# Patient Record
Sex: Female | Born: 1965
Health system: Southern US, Community
[De-identification: ages and names within clinical notes are randomized; demographics above are authoritative.]

## PROBLEM LIST (undated history)

## (undated) DIAGNOSIS — I10 Essential (primary) hypertension: Secondary | ICD-10-CM

## (undated) DIAGNOSIS — E079 Disorder of thyroid, unspecified: Secondary | ICD-10-CM

## (undated) DIAGNOSIS — F419 Anxiety disorder, unspecified: Secondary | ICD-10-CM

## (undated) DIAGNOSIS — F32A Depression, unspecified: Secondary | ICD-10-CM

## (undated) HISTORY — PX: WISDOM TOOTH EXTRACTION: SHX21

## (undated) HISTORY — DX: Depression, unspecified: F32.A

## (undated) HISTORY — DX: Disorder of thyroid, unspecified: E07.9

## (undated) HISTORY — DX: Anxiety disorder, unspecified: F41.9

## (undated) HISTORY — DX: Essential (primary) hypertension: I10

---

## 2015-07-07 ENCOUNTER — Encounter: Payer: Self-pay | Admitting: Family Medicine

## 2015-07-07 ENCOUNTER — Ambulatory Visit (INDEPENDENT_AMBULATORY_CARE_PROVIDER_SITE_OTHER): Payer: BLUE CROSS/BLUE SHIELD | Admitting: Family Medicine

## 2015-07-07 ENCOUNTER — Ambulatory Visit (INDEPENDENT_AMBULATORY_CARE_PROVIDER_SITE_OTHER): Payer: BLUE CROSS/BLUE SHIELD

## 2015-07-07 ENCOUNTER — Telehealth: Payer: Self-pay | Admitting: Family Medicine

## 2015-07-07 VITALS — BP 132/82 | HR 86 | Temp 97.7°F | Resp 18

## 2015-07-07 DIAGNOSIS — R002 Palpitations: Secondary | ICD-10-CM | POA: Diagnosis not present

## 2015-07-07 DIAGNOSIS — R0600 Dyspnea, unspecified: Secondary | ICD-10-CM | POA: Diagnosis not present

## 2015-07-07 DIAGNOSIS — R079 Chest pain, unspecified: Secondary | ICD-10-CM

## 2015-07-07 DIAGNOSIS — M898X1 Other specified disorders of bone, shoulder: Secondary | ICD-10-CM

## 2015-07-07 DIAGNOSIS — F411 Generalized anxiety disorder: Secondary | ICD-10-CM | POA: Diagnosis not present

## 2015-07-07 LAB — COMPREHENSIVE METABOLIC PANEL
ALT: 36 U/L — ABNORMAL HIGH (ref 6–29)
AST: 24 U/L (ref 10–35)
Albumin: 4.4 g/dL (ref 3.6–5.1)
Alkaline Phosphatase: 78 U/L (ref 33–115)
BUN: 14 mg/dL (ref 7–25)
CHLORIDE: 101 mmol/L (ref 98–110)
CO2: 26 mmol/L (ref 20–31)
CREATININE: 0.58 mg/dL (ref 0.50–1.10)
Calcium: 9.5 mg/dL (ref 8.6–10.2)
GLUCOSE: 85 mg/dL (ref 65–99)
POTASSIUM: 4.3 mmol/L (ref 3.5–5.3)
SODIUM: 140 mmol/L (ref 135–146)
TOTAL PROTEIN: 6.8 g/dL (ref 6.1–8.1)
Total Bilirubin: 0.5 mg/dL (ref 0.2–1.2)

## 2015-07-07 LAB — LIPID PANEL
CHOL/HDL RATIO: 3.8 ratio (ref <=5.0)
Cholesterol: 180 mg/dL (ref 125–200)
HDL: 48 mg/dL (ref >=46)
LDL CALC: 100 mg/dL (ref <130)
Triglycerides: 158 mg/dL — ABNORMAL HIGH (ref <150)
VLDL: 32 mg/dL — AB (ref <30)

## 2015-07-07 LAB — TROPONIN I: Troponin I: <0.01 ng/mL (ref <0.06)

## 2015-07-07 LAB — TSH: TSH: 4.873 u[IU]/mL — ABNORMAL HIGH (ref 0.350–4.500)

## 2015-07-07 MED ORDER — LORAZEPAM 0.5 MG PO TABS: 0.5000 mg | ORAL_TABLET | Freq: Two times a day (BID) | ORAL | Status: DC | PRN

## 2015-07-07 NOTE — Telephone Encounter (Signed)
[facility] spoke to [place] about the STAT Troponin lab. Alli spoke to Dr. Alwyn Ren about the result. Hopper verbalized to call patient and inform her the Lab is normal. Patient was notified and voiced understanding.

## 2015-07-07 NOTE — Patient Instructions (Addendum)
Get some Debrox or Murine Drops and try to clean out left ear as directed  In the event of any worsening chest pain or call 911 and go to the emergency room  Take Aleve 2 pills twice daily for pain and inflammation in the back  Referral is being made to cardiology to evaluate you for the palpitations  Take lorazepam 0.5 mg 1 twice daily for anxiety  Return if further problems

## 2015-07-07 NOTE — Progress Notes (Signed)
Chief complaint: Palpitations, shortness of breath, and scapular pain Subjective:  Patient ID: Mary Horn, female    DOB: [birth year]  Age: 49 y.o. MRN: [id number]  49 year old lady who comes in here for the first time. She came in from work. For the last 3 days she has had some pain in her left scapular area, actually just medial to the scapula. Certain positions seem to hurt more. She had that Sunday, yesterday, and today a little bit when she went to work. She has been under stress at work with deadlines. She started having some sensation of breathing harder and palpitations and heart racing. She said she felt a little funny in her left arm, not really numbness, not pain. She did not have chest pain. She was advised to come on in here and get checked. She does not smoke. There is no significant family history of heart disease. She has never had any cardiovascular diseases. She has been getting a little more winded than usual of late. She had a similar episode to this about a month ago. She is single, lives alone, does not get regular exercise. She is in a position of having to help meet some deadlines.  Constitutional: Unremarkable except for some dyspnea on exertion HEENT: Negative Respiratory: As above Cardiovascular: As above GI: Unremarkable GU: Unremarkable. Early menopause. Muscular skeletal: Scapula pain is noted Dermatologic: Unremarkable Neurologic: Unremarkable Psychiatric: Anxious and stressed Endocrine: Not diabetic Objective:   Pleasant lady, alert, anxious looking. Had a couple of tears in her eyes. Her left ear is been bothering her also. The left TM is occluded with cerumen. Right has a little bit in the canal. Throat clear. Neck supple without nodes. Chest is clear to auscultation. Heart regular without murmurs gallops or arrhythmias. No chest wall tenderness. Abdomen soft without mass or tenderness. Has a little tenderness just medial to the right scapula.  EKG normal sinus  rhythm. Minimal decrease of transition of R waves, probably insignificant. No ST segment changes right normal.  [facility] reading (PRIMARY) by  Dr. Alwyn Ren Normal chest x-ray.    Assessment & Plan:   Assessment:  Palpitations Scapula pain Shortness of breath Stress/anxiety Abnormal sensation left arm Cerumen impaction left ear  Plan:  Treat for the stress and anxiety and back pain. Call ahead and make referral to cardiology for the palpitations though I do not detect any arrhythmia and feel like this is a benign entity. Discussed with patient in detail. Do have a troponin pending today just to be on the safe side.  Patient Instructions  Get some Debrox or Murine Drops and try to clean out left ear as directed  In the event of any worsening chest pain or call 911 and go to the emergency room  Take Aleve 2 pills twice daily for pain and inflammation in the back  Referral is being made to cardiology to evaluate you for the palpitations  Take lorazepam 0.5 mg 1 twice daily for anxiety  Return if further problems   Got the result back on the troponin and it was normal and I had the staff call her. Breannah Kratt, MD 07/07/2015

## 2015-07-12 ENCOUNTER — Telehealth: Payer: Self-pay | Admitting: Family Medicine

## 2015-07-12 NOTE — Telephone Encounter (Signed)
Please review labs patient wanting to know results

## 2015-07-14 ENCOUNTER — Encounter: Payer: Self-pay | Admitting: Family Medicine

## 2015-08-04 ENCOUNTER — Encounter: Payer: Self-pay | Admitting: Cardiology

## 2015-08-04 DIAGNOSIS — E669 Obesity, unspecified: Secondary | ICD-10-CM | POA: Insufficient documentation

## 2015-08-04 NOTE — Progress Notes (Signed)
Patient ID: Beretta Ginsberg, female   DOB: 1966/11/11, 49 y.o.   MRN: [id number]   Charitie, Hinote    Date of visit:  08/04/2015 DOB:  1966-02-22    Age:  48 yrs. Medical record number:  [id number]     Account number:  [id number] Primary Care Provider: HOPPER,DAVID ____________________________ CURRENT DIAGNOSES  1. Obesity  2. Palpitations  3. Chest pain ____________________________ ALLERGIES  No Known Allergies ____________________________ MEDICATIONS  1. lorazepam 0.5 mg tablet, BID ____________________________ CHIEF COMPLAINTS  Followup of Chest pain  Palp ____________________________ HISTORY OF PRESENT ILLNESS This very nice 49 year old female is seen for evaluation of shoulder and back pain as well as palpitations. The patient other than obesity has been in good health and is really not seen a doctor in the past 10 years. Several weeks ago she developed fairly severe pain in her left upper shoulder and in her mid back that occurred at night and was severe and lasted for several minutes. It occurred on and off during the night and then ay some. She later went to work and had another episode that occurred at work and was concerned about it as it was associated with some palpitations and some mild shortness of breath. She went and saw Dr. Alwyn Ren and has not had any recurrent symptoms since then. She did admit to being under a lot of situational stress at her work place with deadlines and also has noted intense personal stress over the past several years related to her job situation as well as some family deaths. She currently lives alone. She does not have exertional symptoms. She has been obese but does not weigh and doesn't want to know how much her weight is. The more she thought about her shoulder pain she would have palpitations and then developed some vague pain in her anterior chest when she would have the shoulder pain. The symptoms are nonexertional and are not related to food. They're not  pleuritic ____________________________ PAST HISTORY  Past Medical Illnesses:  obesity, denies hypertension or diabetes;  Cardiovascular Illnesses:  no previous history of cardiac disease.;  Surgical Procedures:  no prior surgical procedures;  NYHA Classification:  I;  Canadian Angina Classification:  Class 0: Asymptomatic;  Cardiology Procedures-Invasive:  no history of prior cardiac procedures;  Cardiology Procedures-Noninvasive:  no previous non-invasive procedures;  LVEF not documented,   ____________________________ CARDIO-PULMONARY TEST DATES EKG Date:  08/04/2015;   ____________________________ FAMILY HISTORY Father -- Father alive with problem Mother -- Mother dead, Carcinoma of the pancreas, Carcinoma of breast Sister -- Sister alive with problem, Seizure disorder, Fibrocystic disease Sister -- Sister alive with problem, Hypertension ____________________________ SOCIAL HISTORY Alcohol Use:  rarely;  Smoking:  never smoked;  Diet:  regular diet;  Lifestyle:  single;  Exercise:  some exercise for approximately 30 minutes 3 days per week;  Occupation:  Orthoptist;  Residence:  lives alone;   ____________________________ REVIEW OF SYSTEMS General:  obesity, weight gain  Integumentary:no rashes or new skin lesions. Eyes: Lasik surgery Ears, Nose, Throat, Mouth:  denies any hearing loss, epistaxis, hoarseness or difficulty speaking. Respiratory: denies dyspnea, cough, wheezing or hemoptysis. Cardiovascular:  please review HPI Abdominal: denies dyspepsia, GI bleeding, constipation, or diarrheaGenitourinary-Female: no dysuria, urgency, frequency, UTIs, or stress incontinence Musculoskeletal:  denies arthritis, venous insufficiency, or muscle weakness Neurological:  denies headaches, stroke, or TIA Psychiatric:  job stress Hematological/Immunologic:  denies any food allergies, bleeding disorders. ____________________________ PHYSICAL EXAMINATION VITAL SIGNS  Blood Pressure:   154/80 Sitting,  Right arm, regular cuff  , 150/76 Standing, Right arm and regular cuff   Pulse:  76/min. Weight:  217.00 lbs. Height:  65"BMI: 36  Constitutional:  pleasant white female, in no acute distress, moderately obese Skin:  warm and dry to touch, no apparent skin lesions, or masses noted. Head:  normocephalic, normal hair pattern, no masses or tenderness Eyes:  EOMS Intact, PERRLA, C and S clear, Funduscopic exam not done. ENT:  ears, nose and throat reveal no gross abnormalities.  Dentition good. Neck:  supple, without massess. No JVD, thyromegaly or carotid bruits. Carotid upstroke normal. Chest:  normal symmetry, clear to auscultation. Cardiac:  regular rhythm, normal S1 and S2, No S3 or S4, no murmurs, gallops or rubs detected. Abdomen:  abdomen soft,non-tender, no masses, no hepatospenomegaly, or aneurysm noted Peripheral Pulses:  the femoral,dorsalis pedis, and posterior tibial pulses are full and equal bilaterally with no bruits auscultated. Extremities & Back:  no deformities, clubbing, cyanosis, erythema or edema observed. Normal muscle strength and tone. Neurological:  no gross motor or sensory deficits noted, affect appropriate, oriented x3. ____________________________ MOST RECENT LIPID PANEL 07/07/15  CHOL TOTL 180 mg/dl, LDL 161 NM, HDL 48 mg/dl, TRIGLYCER 096 mg/dl and CHOL/HDL 3.8 (Calc) ____________________________ IMPRESSIONS/PLAN  1. Somewhat atypical left shoulder and mid back pain 2. Obesity 3. Palpitations 4. Elevation of BP without prior diagnosis of hyertension  Recommendations:  She has a normal exam and her EKG today is normal. Reportedly her chest x-ray was normal. My recommendations would be for her to have an exercise treadmill test and that she wear an event monitor to evaluate for palpitations. I recommended following a carbohydrate restricted diet and trying to lose weight and getting regular exercise once we know that her treadmill is okay. Her  blood pressure was elevated today but has not been elevated previously. I recommended that she tried to lose weight, restrict her salt and will monitor her blood pressure next time she is in the office.Thank you for asking me to see her with you. ____________________________ TODAYS ORDERS  1. [place]: Today  2. Return Visit: 1 month  3. 12 Lead EKG: Today                       ____________________________ Cardiology Physician:  Darden Palmer MD [facility]

## 2015-12-03 ENCOUNTER — Telehealth: Payer: Self-pay

## 2015-12-03 NOTE — Telephone Encounter (Signed)
Med Refill  LORazepam (ATIVAN) 0.5 MG tablet [phone] (H)

## 2015-12-05 NOTE — Telephone Encounter (Signed)
Call:  Return for reassessment before considering any refills.  Mary [facility]. [facility]

## 2015-12-07 NOTE — Telephone Encounter (Signed)
Left message to RTC for additional refills.

## 2018-09-03 ENCOUNTER — Encounter (INDEPENDENT_AMBULATORY_CARE_PROVIDER_SITE_OTHER): Payer: Self-pay | Admitting: Family Medicine

## 2018-09-03 ENCOUNTER — Ambulatory Visit (INDEPENDENT_AMBULATORY_CARE_PROVIDER_SITE_OTHER): Payer: BLUE CROSS/BLUE SHIELD | Admitting: Family Medicine

## 2018-09-03 VITALS — BP 154/100 | HR 69 | Ht 65.0 in | Wt 200.0 lb

## 2018-09-03 DIAGNOSIS — R030 Elevated blood-pressure reading, without diagnosis of hypertension: Secondary | ICD-10-CM | POA: Diagnosis not present

## 2018-09-03 DIAGNOSIS — E669 Obesity, unspecified: Secondary | ICD-10-CM

## 2018-09-03 DIAGNOSIS — Z1211 Encounter for screening for malignant neoplasm of colon: Secondary | ICD-10-CM

## 2018-09-03 DIAGNOSIS — R5383 Other fatigue: Secondary | ICD-10-CM

## 2018-09-03 DIAGNOSIS — R7989 Other specified abnormal findings of blood chemistry: Secondary | ICD-10-CM

## 2018-09-03 DIAGNOSIS — R110 Nausea: Secondary | ICD-10-CM | POA: Diagnosis not present

## 2018-09-03 DIAGNOSIS — Z1239 Encounter for other screening for malignant neoplasm of breast: Secondary | ICD-10-CM

## 2018-09-03 NOTE — Progress Notes (Signed)
Office Visit Note   Patient: Mary Horn           Date of Birth: November 26, 1965           MRN: [id number] Visit Date: 09/03/2018 Requested by: No referring provider defined for this encounter. PCP: Patient, No Pcp Per  Subjective: Chief Complaint  Patient presents with  . Establish PCP    HPI: She is here to establish care.  She was referred by Illa Level.  She has not been to a doctor in a couple years since she had an episode of chest pain, and before that it had been probably 5 years or more since she ever went for a checkup.  She has been concerned about her health lately.  She knows she is overweight and does not eat healthfully, she is not physically active.  A lot of her coworkers are very healthy and encouraged her to get a checkup.  She is tired much of the time.  She gets nausea in the mornings lasting several hours most days.  Occasionally she gets acid reflux.  Denies constipation or diarrhea.  Denies any hair loss or skin/nail changes.  She has checked her blood pressure at home and it has been consistently mildly elevated.  She has never had her machine calibrated.  When she was seen for chest pain a couple years ago her blood pressure was elevated at that time.  She has never been on medications for this.  She does have a family history of hypertension and her father.  She has not had colonoscopy yet.  She had a mammogram once which was normal.  She has been menopausal for 11 years but 2 years ago she had a menstrual period.  She has not had any bleeding since then.                ROS: 2 years ago her TSH was elevated.  Other systems were reviewed and are negative.  Objective: Vital Signs: BP (!) 154/100   Pulse 69   Ht 5\' 5"  (1.651 m)   Wt 200 lb (90.7 kg)   BMI 33.28 kg/m   Physical Exam:  HEENT:  [place]/AT, PERRLA, EOM Full, no nystagmus.  Funduscopic examination within normal limits.  No conjunctival erythema.  Tympanic membranes are pearly gray with  normal landmarks.  External ear canals are normal.  Nasal passages are clear.  Oropharynx is clear.  No significant lymphadenopathy.  No thyromegaly or nodules.  2+ carotid pulses without bruits. CV: Regular rate and rhythm without murmurs, rubs, or gallops.  No peripheral edema.  2+ radial and posterior tibial pulses. Lungs: Clear to auscultation throughout with no wheezing or areas of consolidation. Abd:  No HSM Ext: 2+ upper and lower DTRs, no skin or nail changes.   Imaging: None today.  Assessment & Plan: 1.  Health maintenance -We will schedule a mammogram and screening colonoscopy.  2.  Elevated blood pressure without hypertension -Start taking magnesium, minimize sodium intake, begin walking for exercise. -She will bring her home blood pressure machine in for calibration. -Return in 3 to 4 months for recheck, if blood pressure is still elevated we might start medication.  3.  Fatigue -Labs to evaluate.  4.  Obesity -Dietary changes recommended.  5.  Chronic nausea -If labs are unremarkable and symptoms persist, she might require upper endoscopy.  Abdominal CT scan would be a consideration as well since she does have a family history of pancreatic cancer into family members.  6.  History of vaginal bleeding 2 years ago -If this happens again then gynecology consult.    Follow-Up Instructions: Return in about 3 months (around 12/04/2018).       Procedures: None today.   PMFS History: Patient Active Problem List   Diagnosis Date Noted  . Obesity (BMI 30-39.9)    History reviewed. No pertinent past medical history.  History reviewed. No pertinent family history.  History reviewed. No pertinent surgical history. Social History   Occupational History  . Not on file  Tobacco Use  . Smoking status: Never Smoker  . Smokeless tobacco: Never Used  Substance and Sexual Activity  . Alcohol use: Not on file  . Drug use: Not on file  . Sexual activity: Not on file

## 2018-09-03 NOTE — Patient Instructions (Signed)
    Blood pressure:    - Magnesium 400 mg daily  - Minimize sodium intake  - Walk 20-30 minutes several days per week

## 2018-09-04 ENCOUNTER — Telehealth (INDEPENDENT_AMBULATORY_CARE_PROVIDER_SITE_OTHER): Payer: Self-pay | Admitting: Family Medicine

## 2018-09-04 ENCOUNTER — Ambulatory Visit
Admission: RE | Admit: 2018-09-04 | Discharge: 2018-09-04 | Disposition: A | Discharge status: Home / Self Care | Payer: 59 | Source: Ambulatory Visit | Attending: Family Medicine | Admitting: Family Medicine

## 2018-09-04 ENCOUNTER — Other Ambulatory Visit (INDEPENDENT_AMBULATORY_CARE_PROVIDER_SITE_OTHER): Payer: Self-pay | Admitting: Family Medicine

## 2018-09-04 ENCOUNTER — Telehealth (INDEPENDENT_AMBULATORY_CARE_PROVIDER_SITE_OTHER): Payer: Self-pay

## 2018-09-04 DIAGNOSIS — Z1239 Encounter for other screening for malignant neoplasm of breast: Secondary | ICD-10-CM

## 2018-09-04 DIAGNOSIS — E039 Hypothyroidism, unspecified: Secondary | ICD-10-CM

## 2018-09-04 DIAGNOSIS — E785 Hyperlipidemia, unspecified: Secondary | ICD-10-CM

## 2018-09-04 DIAGNOSIS — R739 Hyperglycemia, unspecified: Secondary | ICD-10-CM

## 2018-09-04 DIAGNOSIS — E559 Vitamin D deficiency, unspecified: Secondary | ICD-10-CM

## 2018-09-04 LAB — CBC WITH DIFFERENTIAL/PLATELET
BASOS ABS: 86 {cells}/uL (ref 0–200)
Basophils Relative: 1 %
Eosinophils Absolute: 224 cells/uL (ref 15–500)
Eosinophils Relative: 2.6 %
HEMATOCRIT: 42.3 % (ref 35.0–45.0)
Hemoglobin: 14.2 g/dL (ref 11.7–15.5)
Lymphs Abs: 2597 cells/uL (ref 850–3900)
MCH: 29.8 pg (ref 27.0–33.0)
MCHC: 33.6 g/dL (ref 32.0–36.0)
MCV: 88.7 fL (ref 80.0–100.0)
MPV: 10.5 fL (ref 7.5–12.5)
Monocytes Relative: 6.9 %
NEUTROS PCT: 59.3 %
Neutro Abs: 5100 cells/uL (ref 1500–7800)
PLATELETS: 349 10*3/uL (ref 140–400)
RBC: 4.77 10*6/uL (ref 3.80–5.10)
RDW: 13 % (ref 11.0–15.0)
TOTAL LYMPHOCYTE: 30.2 %
WBC mixed population: 593 cells/uL (ref 200–950)
WBC: 8.6 10*3/uL (ref 3.8–10.8)

## 2018-09-04 LAB — HEMOGLOBIN A1C
HEMOGLOBIN A1C: 5.9 %{Hb} — AB (ref <5.7)
Mean Plasma Glucose: 123 (calc)
eAG (mmol/L): 6.8 (calc)

## 2018-09-04 LAB — FERRITIN: Ferritin: 68 ng/mL (ref 16–232)

## 2018-09-04 LAB — LIPID PANEL
CHOLESTEROL: 234 mg/dL — AB (ref <200)
HDL: 56 mg/dL (ref >50)
LDL CHOLESTEROL (CALC): 141 mg/dL — AB
Non-HDL Cholesterol (Calc): 178 mg/dL (calc) — ABNORMAL HIGH (ref <130)
TRIGLYCERIDES: 229 mg/dL — AB (ref <150)
Total CHOL/HDL Ratio: 4.2 (calc) (ref <5.0)

## 2018-09-04 LAB — COMPREHENSIVE METABOLIC PANEL
AG Ratio: 2 (calc) (ref 1.0–2.5)
ALBUMIN MSPROF: 4.9 g/dL (ref 3.6–5.1)
ALKALINE PHOSPHATASE (APISO): 86 U/L (ref 33–130)
ALT: 20 U/L (ref 6–29)
AST: 16 U/L (ref 10–35)
BUN: 15 mg/dL (ref 7–25)
CHLORIDE: 103 mmol/L (ref 98–110)
CO2: 25 mmol/L (ref 20–32)
Calcium: 10.8 mg/dL — ABNORMAL HIGH (ref 8.6–10.4)
Creat: 0.66 mg/dL (ref 0.50–1.05)
GLOBULIN: 2.5 g/dL (ref 1.9–3.7)
Glucose, Bld: 69 mg/dL (ref 65–99)
POTASSIUM: 4.5 mmol/L (ref 3.5–5.3)
SODIUM: 147 mmol/L — AB (ref 135–146)
TOTAL PROTEIN: 7.4 g/dL (ref 6.1–8.1)
Total Bilirubin: 0.4 mg/dL (ref 0.2–1.2)

## 2018-09-04 LAB — HIGH SENSITIVITY CRP: hs-CRP: 2.8 mg/L

## 2018-09-04 LAB — SPECIMEN COMPROMISED

## 2018-09-04 LAB — THYROID PANEL WITH TSH
Free Thyroxine Index: 2.1 (ref 1.4–3.8)
T3 UPTAKE: 29 % (ref 22–35)
T4, Total: 7.2 ug/dL (ref 5.1–11.9)
TSH: 13.04 mIU/L — ABNORMAL HIGH

## 2018-09-04 LAB — VITAMIN D 25 HYDROXY (VIT D DEFICIENCY, FRACTURES): VIT D 25 HYDROXY: 15 ng/mL — AB (ref 30–100)

## 2018-09-04 MED ORDER — LEVOTHYROXINE SODIUM 50 MCG PO TABS: 50.0000 ug | ORAL_TABLET | Freq: Every day | ORAL | 6 refills | Status: DC

## 2018-09-04 NOTE — Telephone Encounter (Signed)
Rx sent.  Labs in about 3 months.

## 2018-09-04 NOTE — Telephone Encounter (Signed)
Called and left a VM advising patient to call back concerning lab results.

## 2018-09-04 NOTE — Telephone Encounter (Signed)
Patient returned my call concerning lab results.  Advised patient of lab results per Dr. Prince Rome.  Patient would like to know if a Rx for Synthroid will be sent to the pharmacy or will she need to go and by something over the counter?   Patient would like a copy of the lab results mailed to her current address. Cb# is [phone].  Please advise.  Thank you.

## 2018-09-04 NOTE — Telephone Encounter (Signed)
Labs are notable for the following:  Hemoglobin A1c is elevated in prediabetes range at 5.9.  It is very important to maintain a regular exercise regimen and to minimize dietary intake of processed carbohydrates including breads, pastas, cereals, sugars and sweets.  We should recheck this in about 6 months to be sure it is not approaching diabetes range.  Lipids are elevated but these should improve with lifestyle changes mentioned above.  Thyroid TSH is elevated at 13.04, indicating hypothyroidism.  I would suggest considering starting Synthroid to treat this.  If in agreement, we will recheck in about 3 to 4 months.  Vitamin D is very low at 15.  We want this to be above 50.  I recommend taking vitamin D-3, 5000 IU tablets, 2 of them daily for 3 months and then 1 daily long-term after that.  Calcium level is slightly elevated at 10.8.  Reason is unclear.  No treatment needed, but we will recheck this in 3 to 4 months as well.  Other labs look good.  We can mail her a copy if she would like.

## 2018-09-04 NOTE — Telephone Encounter (Signed)
The patient is agreeable to trying the Synthroid.  She uses [facility].  I am mailing the lab results to her and encouraged her to sign up for MyChart.

## 2018-10-09 ENCOUNTER — Encounter: Payer: Self-pay | Admitting: Family Medicine

## 2018-12-04 ENCOUNTER — Ambulatory Visit (INDEPENDENT_AMBULATORY_CARE_PROVIDER_SITE_OTHER): Payer: 59 | Admitting: Family Medicine

## 2019-07-30 ENCOUNTER — Ambulatory Visit (INDEPENDENT_AMBULATORY_CARE_PROVIDER_SITE_OTHER): Payer: 59 | Admitting: Family Medicine

## 2019-07-30 ENCOUNTER — Encounter: Payer: Self-pay | Admitting: Family Medicine

## 2019-07-30 DIAGNOSIS — M545 Low back pain, unspecified: Secondary | ICD-10-CM

## 2019-07-30 DIAGNOSIS — M79672 Pain in left foot: Secondary | ICD-10-CM | POA: Diagnosis not present

## 2019-07-30 MED ORDER — NABUMETONE 750 MG PO TABS: 750.0000 mg | ORAL_TABLET | Freq: Two times a day (BID) | ORAL | 6 refills | Status: DC | PRN

## 2019-07-30 MED ORDER — TIZANIDINE HCL 2 MG PO TABS: 2.0000 mg | ORAL_TABLET | Freq: Every evening | ORAL | 1 refills | Status: DC | PRN

## 2019-07-30 NOTE — Progress Notes (Signed)
     Office Visit Note   Patient: Mary Horn           Date of Birth: 1966/07/24           MRN: [id number] Visit Date: 07/30/2019 Requested by: No referring provider defined for this encounter. PCP: Eunice Blase, MD  Subjective: Chief Complaint  Patient presents with  . Lower Back - Pain    Pain across lower back x 2 weeks. NKI, but symptoms started 3 days after a massage. No pain radiating down the legs. No numbness/tingling.  . Left Foot - Pain    Foot started hurting a few days after the back started hurting (she is unsure if it is related). Pain is in lateral aspect of the foot. Hurts with weightbearing.    HPI: She is here with low back and left foot pain.  Her back started hurting a couple weeks ago, no definite injury.  Symptoms started about 3 days after getting a massage.  She thinks she has been walking differently because of her back pain and in the past few days her foot started to hurt on the lateral aspect.  She does not feel like she has sciatica.  She is never had problems like this before lasting this long.  Ibuprofen gives only a little bit of relief.                ROS: No fevers or chills, no bowel or bladder dysfunction.  All other systems were reviewed and are negative.  Objective: Vital Signs: There were no vitals taken for this visit.  Physical Exam:  General:  Alert and oriented, in no acute distress. Pulm:  Breathing unlabored. Psy:  Normal mood, congruent affect. Skin: No rash on her skin. Low back: Tender just above the left iliac crest.  No pain in the sciatic notch or along the spinous processes, no tenderness over the SI joint.  Negative straight leg raise, lower extremity strength and reflexes are normal. Left foot: Slightly tender on the lateral aspect of the fifth metatarsal but negative fulcrum test.  Slightly tender along the peroneal tendons posterior to the lateral malleolus.   Imaging: None today.  Assessment & Plan: 1.  Low back  pain, probably muscular. -Home stretches, anti-inflammatory and muscle relaxant given.  Consider x-rays and possibly physical therapy if symptoms persist.  2.  Left lateral foot pain, etiology uncertain.  Doubt stress fracture given no significant change in activities to account for that.  Could be referred pain from lumbar disc protrusion. -Anti-inflammatories, x-rays if not improved in the next week or 2.     Procedures: No procedures performed  No notes on file     PMFS History: Patient Active Problem List   Diagnosis Date Noted  . Obesity (BMI 30-39.9)    History reviewed. No pertinent past medical history.  Family History  Problem Relation Age of Onset  . Breast cancer Mother   . Breast cancer Maternal Aunt     History reviewed. No pertinent surgical history. Social History   Occupational History  . Not on file  Tobacco Use  . Smoking status: Never Smoker  . Smokeless tobacco: Never Used  Substance and Sexual Activity  . Alcohol use: Not on file  . Drug use: Not on file  . Sexual activity: Not on file

## 2020-04-21 ENCOUNTER — Encounter: Payer: Self-pay | Admitting: Family Medicine

## 2020-04-21 DIAGNOSIS — H938X3 Other specified disorders of ear, bilateral: Secondary | ICD-10-CM

## 2020-04-21 DIAGNOSIS — J302 Other seasonal allergic rhinitis: Secondary | ICD-10-CM

## 2020-04-23 NOTE — Addendum Note (Signed)
Addended by: Lillia Carmel on: 04/23/2020 11:50 AM   Modules accepted: Orders

## 2020-05-03 IMAGING — MG DIGITAL SCREENING BILATERAL MAMMOGRAM WITH TOMO AND CAD
8 series · 8 of 24 positions shown · non-contrast
Comparison: None.

ACR Breast Density Category a: The breast tissue is almost entirely
fatty.

CLINICAL DATA: Screening.

EXAM:
DIGITAL SCREENING BILATERAL MAMMOGRAM WITH TOMO AND CAD

[R MLO synth-2D]
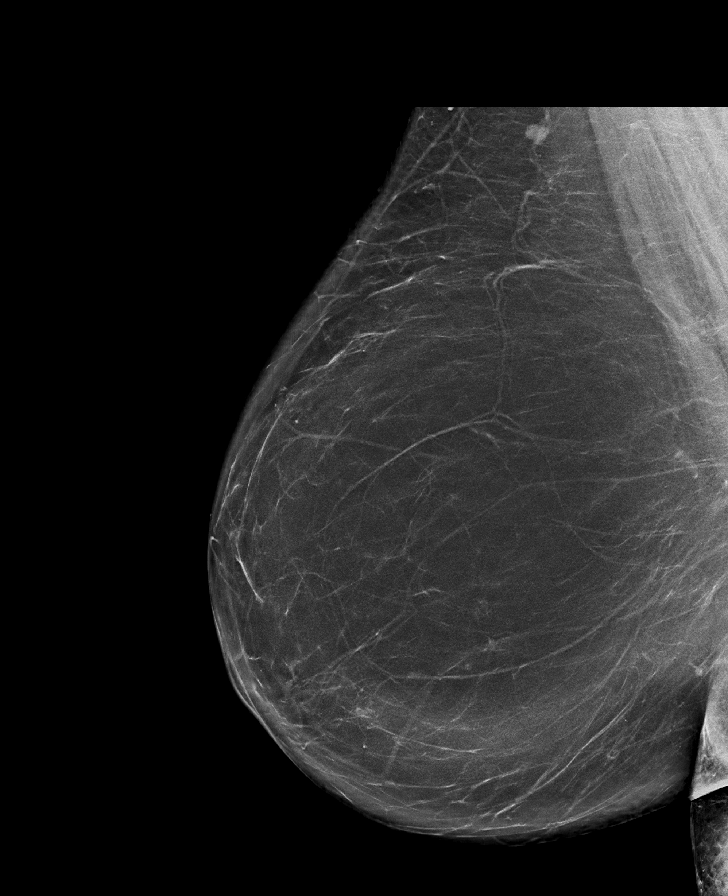

[L MLO synth-2D]
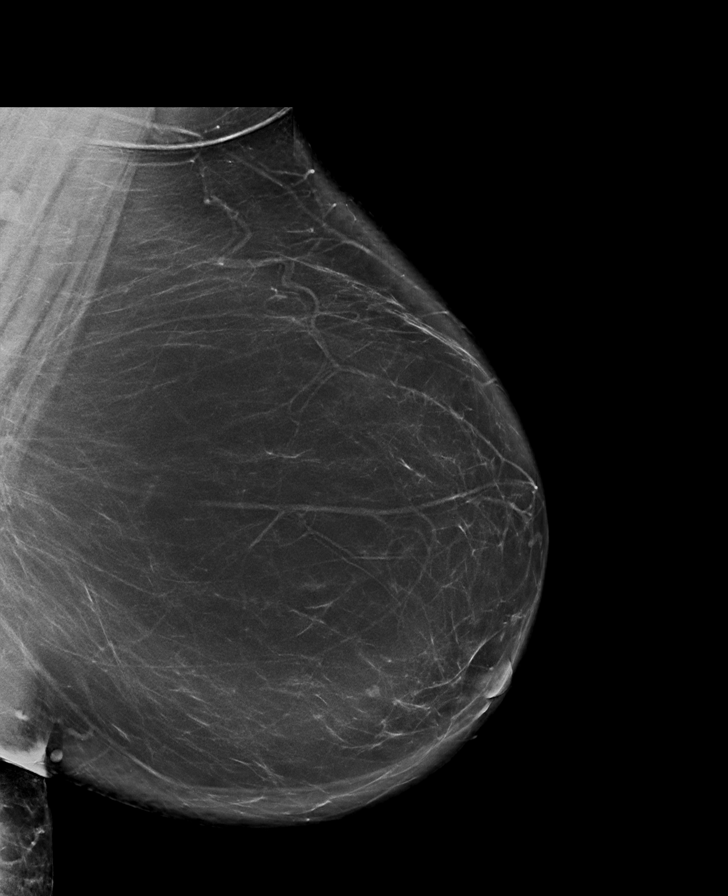

[L CC synth-2D]
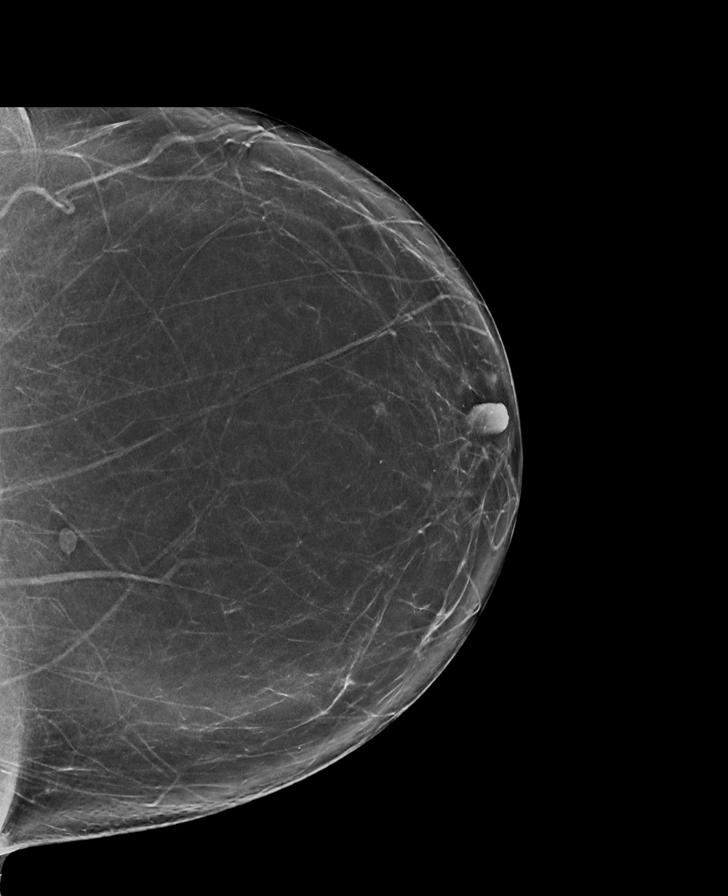

[R CC synth-2D]
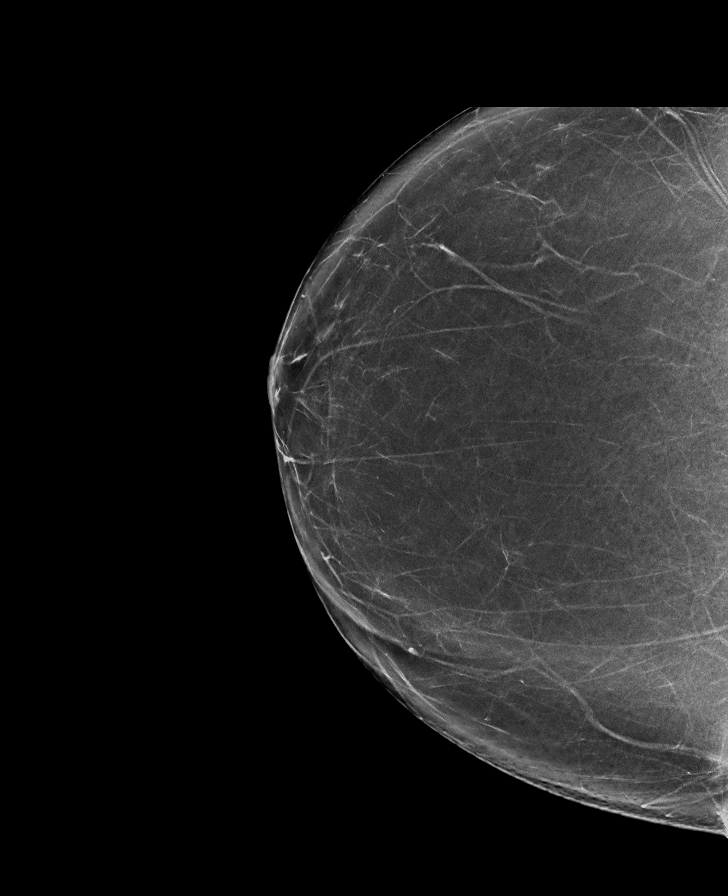

[R CC tomo · tomo slice 41/82.0]
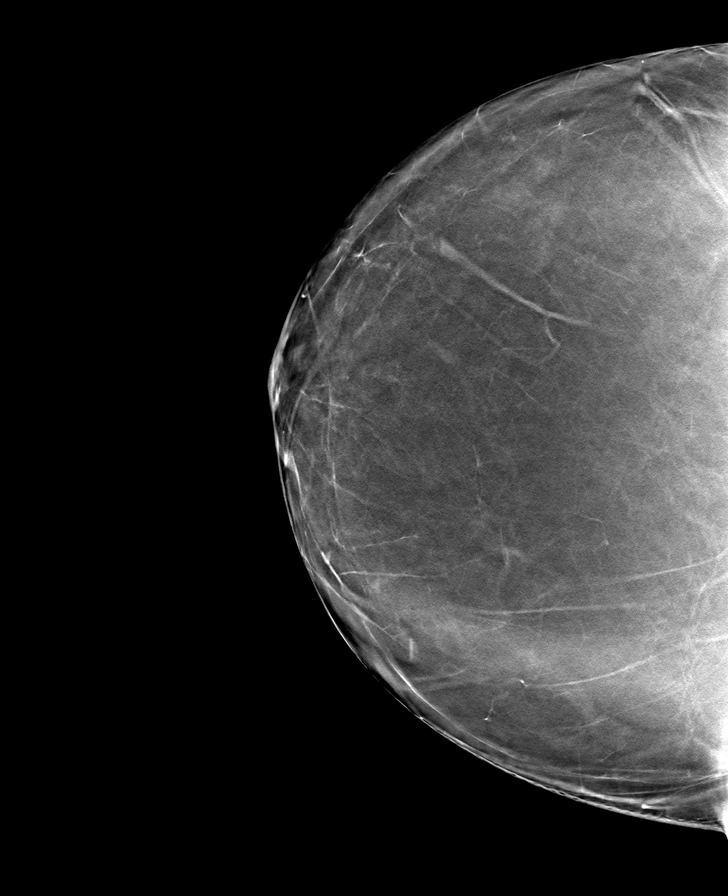

[L MLO tomo · tomo slice 51/100.0]
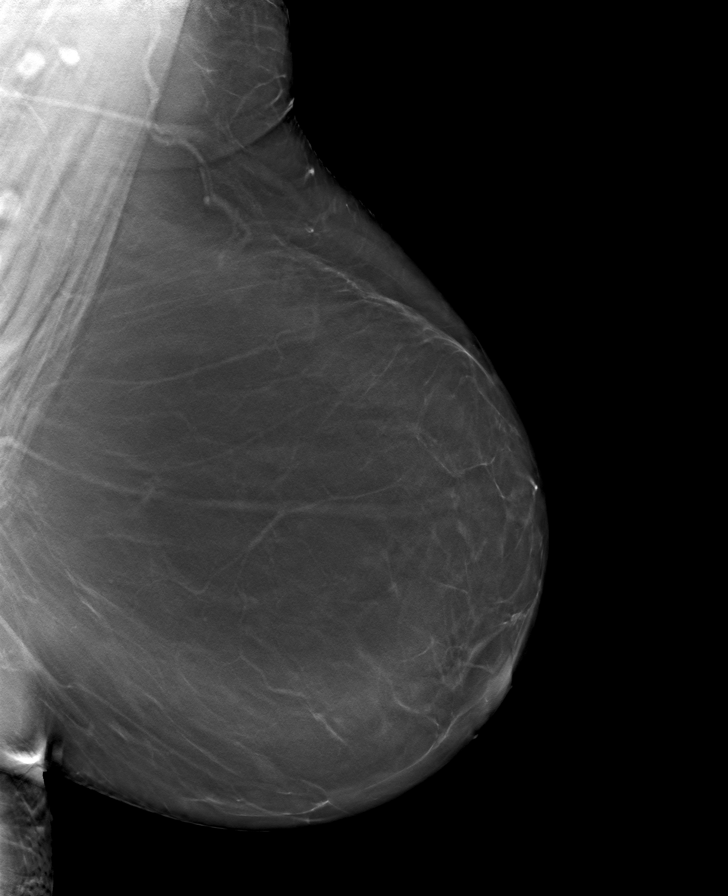

[L CC tomo · tomo slice 45/88.0]
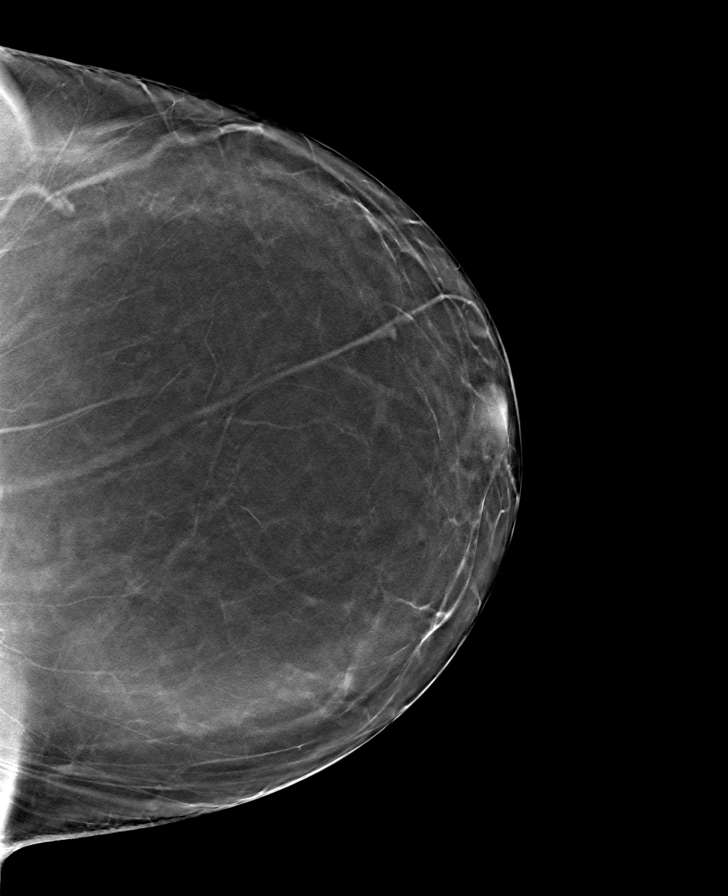

[R MLO tomo · tomo slice 47/92.0]
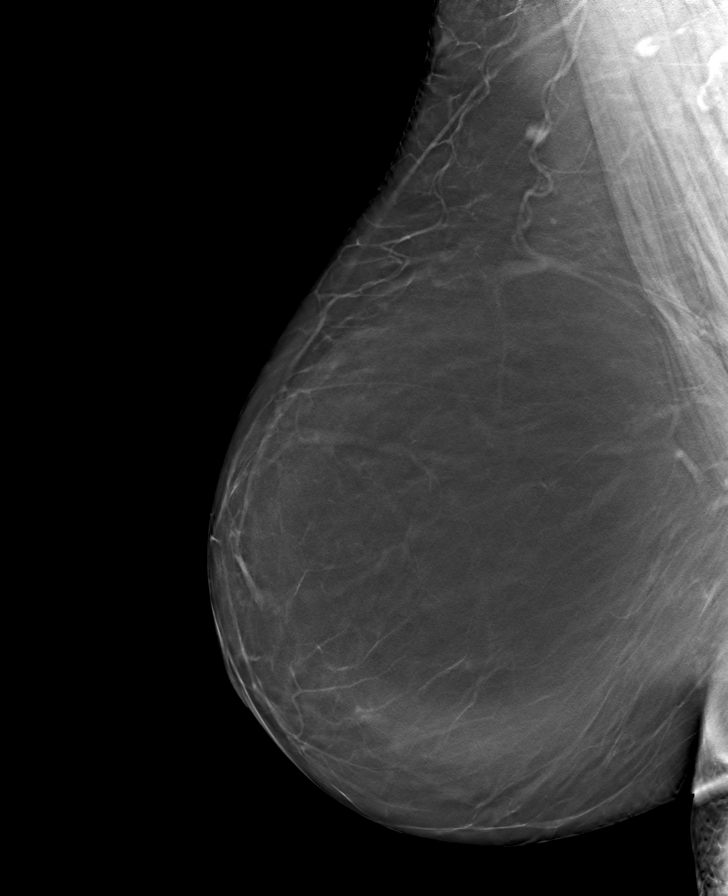

[8 of 24 positions shown; findings below may reference images not displayed]

FINDINGS: There are no findings suspicious for malignancy. Images were
processed with CAD.
IMPRESSION: No mammographic evidence of malignancy. A result letter of this
screening mammogram will be mailed directly to the patient.

RECOMMENDATION:
Screening mammogram in one year. (Code:0P-[id number])

BI-RADS CATEGORY  1: Negative.

## 2020-05-21 ENCOUNTER — Other Ambulatory Visit: Payer: Self-pay

## 2020-05-21 ENCOUNTER — Encounter: Payer: Self-pay | Admitting: Allergy

## 2020-05-21 ENCOUNTER — Ambulatory Visit (INDEPENDENT_AMBULATORY_CARE_PROVIDER_SITE_OTHER): Payer: 59 | Admitting: Allergy

## 2020-05-21 VITALS — BP 130/80 | HR 87 | Resp 18 | Ht 65.5 in | Wt 222.4 lb

## 2020-05-21 DIAGNOSIS — J3089 Other allergic rhinitis: Secondary | ICD-10-CM

## 2020-05-21 DIAGNOSIS — H6983 Other specified disorders of Eustachian tube, bilateral: Secondary | ICD-10-CM | POA: Diagnosis not present

## 2020-05-21 NOTE — Patient Instructions (Addendum)
Today's skin testing showed: Positive to mold, dust mite. Borderline cockroach.  Environmental allergies  Start environmental control measures as below.  May use over the counter antihistamines such as Zyrtec (cetirizine), Claritin (loratadine), Allegra (fexofenadine), or Xyzal (levocetirizine) daily as needed.  Continue with Flonase 1 spray twice a day.  Nasal saline spray (i.e., Simply Saline) or nasal saline lavage (i.e., NeilMed) is recommended as needed and prior to medicated nasal sprays.  Follow up in 3 months or sooner if needed.  Follow up with ENT as scheduled and continue with their recommendations.   Control of House Dust Mite Allergen . Dust mite allergens are a common trigger of allergy and asthma symptoms. While they can be found throughout the house, these microscopic creatures thrive in warm, humid environments such as bedding, upholstered furniture and carpeting. . Because so much time is spent in the bedroom, it is essential to reduce mite levels there.  . Encase pillows, mattresses, and box springs in special allergen-proof fabric covers or airtight, zippered plastic covers.  . Bedding should be washed weekly in hot water (130 F) and dried in a hot dryer. Allergen-proof covers are available for comforters and pillows that can't be regularly washed.  Reyes Ivan the allergy-proof covers every few months. Minimize clutter in the bedroom. Keep pets out of the bedroom.  Marland Kitchen Keep humidity less than 50% by using a dehumidifier or air conditioning. You can buy a humidity measuring device called a hygrometer to monitor this.  . If possible, replace carpets with hardwood, linoleum, or washable area rugs. If that's not possible, vacuum frequently with a vacuum that has a HEPA filter. . Remove all upholstered furniture and non-washable window drapes from the bedroom. . Remove all non-washable stuffed toys from the bedroom.  Wash stuffed toys weekly. Mold Control . Mold and fungi can  grow on a variety of surfaces provided certain temperature and moisture conditions exist.  . Outdoor molds grow on plants, decaying vegetation and soil. The major outdoor mold, Alternaria and Cladosporium, are found in very high numbers during hot and dry conditions. Generally, a late summer - fall peak is seen for common outdoor fungal spores. Rain will temporarily lower outdoor mold spore count, but counts rise rapidly when the rainy period ends. . The most important indoor molds are Aspergillus and Penicillium. Dark, humid and poorly ventilated basements are ideal sites for mold growth. The next most common sites of mold growth are the bathroom and the kitchen. Outdoor (Seasonal) Mold Control . Use air conditioning and keep windows closed. . Avoid exposure to decaying vegetation. Marland Kitchen Avoid leaf raking. . Avoid grain handling. . Consider wearing a face mask if working in moldy areas.  Indoor (Perennial) Mold Control  . Maintain humidity below 50%. . Get rid of mold growth on hard surfaces with water, detergent and, if necessary, 5% bleach (do not mix with other cleaners). Then dry the area completely. If mold covers an area more than 10 square feet, consider hiring an indoor environmental professional. . For clothing, washing with soap and water is best. If moldy items cannot be cleaned and dried, throw them away. . Remove sources e.g. contaminated carpets. . Repair and seal leaking roofs or pipes. Using dehumidifiers in damp basements may be helpful, but empty the water and clean units regularly to prevent mildew from forming. All rooms, especially basements, bathrooms and kitchens, require ventilation and cleaning to deter mold and mildew growth. Avoid carpeting on concrete or damp floors, and storing items in  damp areas. Cockroach Allergen Avoidance Cockroaches are often found in the homes of densely populated urban areas, schools or commercial buildings, but these creatures can lurk almost  anywhere. This does not mean that you have a dirty house or living area. . Block all areas where roaches can enter the home. This includes crevices, wall cracks and windows.  . Cockroaches need water to survive, so fix and seal all leaky faucets and pipes. Have an exterminator go through the house when your family and pets are gone to eliminate any remaining roaches. Marland Kitchen Keep food in lidded containers and put pet food dishes away after your pets are done eating. Vacuum and sweep the floor after meals, and take out garbage and recyclables. Use lidded garbage containers in the kitchen. Wash dishes immediately after use and clean under stoves, refrigerators or toasters where crumbs can accumulate. Wipe off the stove and other kitchen surfaces and cupboards regularly.

## 2020-05-21 NOTE — Assessment & Plan Note (Signed)
Ear fullness episodes for 30 years occurring periodically. Each episode lasting a few weeks at a time. No triggers noted and no correlation with any seasons. Evaluated by ENT and differential diagnosis includes eustachian tube dysfunction and Meniere's disease. Has some nasal congestion as well.   Today's skin testing showed: Positive to mold, dust mite. Borderline to cockroach.  Discussed with patient that I'm not sure how much of the above allergens are contributing to her ear symptoms however it can contribute to her nasal congestion.   Start environmental control measures as below.  May use over the counter antihistamines such as Zyrtec (cetirizine), Claritin (loratadine), Allegra (fexofenadine), or Xyzal (levocetirizine) daily as needed.  Continue with Flonase 1 spray twice a day.  Nasal saline spray (i.e., Simply Saline) or nasal saline lavage (i.e., NeilMed) is recommended as needed and prior to medicated nasal sprays.  Follow up with ENT as scheduled and continue with their recommendations.   If no improvement with the above regimen, will consider allergy immunotherapy in the future.

## 2020-05-21 NOTE — Progress Notes (Signed)
New Patient Note  RE: Mary Horn MRN: [id number] DOB: 12/15/1965 Date of Office Visit: 05/21/2020  Referring provider: Lavada Mesi, MD Primary care provider: Lavada Mesi, MD  Chief Complaint: Ear Fullness  History of Present Illness: I had the pleasure of seeing Mary Horn for initial evaluation at the [facility] on 05/21/2020. She is a 54 y.o. female, who is referred here by Lavada Mesi, MD for the evaluation of ear fullness/allergies. Up to date with COVID-19 vaccine: no  She reports symptoms of ear fullness, crackling in her ear ears.  Some nasal congestion at times.  Symptoms have been going on for 30 years. The symptoms are present periodically and lasts for a few weeks at a time. No correlation with the season.  Other triggers include exposure to unknown. Headache: denies. She has used prednisone, antihistamines with fair improvement in symptoms. Sinus infections: denies. Previous work up includes: no. Previous ENT evaluation: yes and possible Meniere's disease and eustachian tube dysfunction. Hearing test was unremarkable. Scheduled for some other testing.  Previous sinus imaging: no. History of nasal polyps: no. History of reflux: occasionally and takes Tums as needed.  Assessment and Plan: [name] is a 54 y.o. female with: Other allergic rhinitis Ear fullness episodes for 30 years occurring periodically. Each episode lasting a few weeks at a time. No triggers noted and no correlation with any seasons. Evaluated by ENT and differential diagnosis includes eustachian tube dysfunction and Meniere's disease. Has some nasal congestion as well.   Today's skin testing showed: Positive to mold, dust mite. Borderline to cockroach.  Discussed with patient that I'm not sure how much of the above allergens are contributing to her ear symptoms however it can contribute to her nasal congestion.   Start environmental control measures as below.  May use over  the counter antihistamines such as Zyrtec (cetirizine), Claritin (loratadine), Allegra (fexofenadine), or Xyzal (levocetirizine) daily as needed.  Continue with Flonase 1 spray twice a day.  Nasal saline spray (i.e., Simply Saline) or nasal saline lavage (i.e., NeilMed) is recommended as needed and prior to medicated nasal sprays.  Follow up with ENT as scheduled and continue with their recommendations.   If no improvement with the above regimen, will consider allergy immunotherapy in the future.   Return in about 3 months (around 08/21/2020).  Other allergy screening: Asthma: no Food allergy: no Medication allergy: no Hymenoptera allergy: no Urticaria: no Eczema:no History of recurrent infections suggestive of immunodeficency: no  Diagnostics: Skin Testing: Environmental allergy panel. Positive to mold, dust mite. Borderline cockroach. Results discussed with patient/family.  Airborne Adult Perc - 05/21/20 1425    Time Antigen Placed 1425    Allergen Manufacturer Greer    Location Back    Number of Test 59    Panel 1 Select    1. Control-Buffer 50% Glycerol Negative    2. Control-Histamine 1 mg/ml 2+    3. Albumin saline Negative    4. Bahia Negative    5. French Southern Territories Negative    6. Johnson Negative    7. Kentucky Blue Negative    8. Meadow Fescue Negative    9. Perennial Rye Negative    10. Sweet Vernal Negative    11. Timothy Negative    12. Cocklebur Negative    13. Burweed Marshelder Negative    14. Ragweed, short Negative    15. Ragweed, Giant Negative    16. Plantain,  English Negative    17. Lamb's Quarters Negative  18. Sheep Sorrell Negative    19. Rough Pigweed Negative    20. Marsh Elder, Rough Negative    21. Mugwort, Common Negative    22. Ash mix Negative    23. Birch mix Negative    24. Beech American Negative    25. Box, Elder Negative    26. Cedar, red Negative    27. Cottonwood, Guinea-BissauEastern Negative    28. Elm mix Negative    29. Hickory Negative     30. Maple mix Negative    31. Oak, Guinea-Bissau[place]astern mix Negative    32. Pecan Pollen Negative    33. Pine mix Negative    34. Sycamore Eastern Negative    35. Walnut, Black Pollen Negative    36. Alternaria alternata Negative    37. Cladosporium Herbarum Negative    38. Aspergillus mix Negative    39. Penicillium mix Negative    40. Bipolaris sorokiniana (Helminthosporium) Negative    41. Drechslera spicifera (Curvularia) Negative    42. Mucor plumbeus Negative    43. Fusarium moniliforme Negative    44. Aureobasidium pullulans (pullulara) Negative    45. Rhizopus oryzae Negative    46. Botrytis cinera Negative    47. Epicoccum nigrum Negative    48. Phoma betae Negative    49. Candida Albicans Negative    50. Trichophyton mentagrophytes Negative    51. Mite, D Farinae  5,000 AU/ml Negative    52. Mite, D Pteronyssinus  5,000 AU/ml Negative    53. Cat Hair 10,000 BAU/ml Negative    54.  Dog Epithelia Negative    55. Mixed Feathers Negative    56. Horse Epithelia Negative    57. Cockroach, Micrones[place] --   +/-   58. Mouse Negative    59. Tobacco Leaf Negative          Intradermal - 05/21/20 1522    Time Antigen Placed 1512    Allergen Manufacturer Waynette ButteryGreer    Location Arm    Number of Test 14    Control Negative    French Southern TerritoriesBermuda Negative    Johnson Negative    7 Grass Negative    Ragweed mix Negative    Weed mix Negative    Tree mix Negative    Mold 1 Negative    Mold 2 2+    Mold 3 Negative    Mold 4 2+    Cat Negative    Dog Negative    Mite mix 2+    Other Omitted           Past Medical History: Patient Active Problem List   Diagnosis Date Noted   Other allergic rhinitis 05/21/2020   Dysfunction of both eustachian tubes 05/21/2020   Obesity (BMI 30-39.9)    History reviewed. No pertinent past medical history. Past Surgical History: History reviewed. No pertinent surgical history. Medication List:  Current Outpatient Medications  Medication Sig Dispense Refill     fluticasone (FLONASE) 50 MCG/ACT nasal spray Place 1 spray into the nose in the morning and at bedtime.     ZYRTEC-D ALLERGY & CONGESTION 5-120 MG tablet Take 1 tablet by mouth 2 (two) times daily.     levothyroxine (SYNTHROID) 50 MCG tablet Take 1 tablet (50 mcg total) by mouth daily before breakfast. (Patient not taking: Reported on 07/30/2019) 30 tablet 6   LORazepam (ATIVAN) 0.5 MG tablet Take 1 tablet (0.5 mg total) by mouth 2 (two) times daily as needed for anxiety. (Patient not taking: Reported  on 09/03/2018) 30 tablet 1   nabumetone (RELAFEN) 750 MG tablet Take 1 tablet (750 mg total) by mouth 2 (two) times daily as needed. 60 tablet 6   tiZANidine (ZANAFLEX) 2 MG tablet Take 1-2 tablets (2-4 mg total) by mouth at bedtime as needed for muscle spasms. 60 tablet 1   No current facility-administered medications for this visit.   Allergies: No Known Allergies Social History: Social History   Socioeconomic History   Marital status: Single    Spouse name: Not on file   Number of children: Not on file   Years of education: Not on file   Highest education level: Not on file  Occupational History   Not on file  Tobacco Use   Smoking status: Never Smoker   Smokeless tobacco: Never Used  Vaping Use   Vaping Use: Never used  Substance and Sexual Activity   Alcohol use: Yes    Comment: occ   Drug use: Never   Sexual activity: Not on file  Other Topics Concern   Not on file  Social History Narrative   Not on file   Social Determinants of Health   Financial Resource Strain:    Difficulty of Paying Living Expenses:   Food Insecurity:    Worried About Programme researcher, broadcasting/film/video in the Last Year:    Barista in the Last Year:   Transportation Needs:    Freight forwarder (Medical):    Lack of Transportation (Non-Medical):   Physical Activity:    Days of Exercise per Week:    Minutes of Exercise per Session:   Stress:    Feeling of Stress :    Social Connections:    Frequency of Communication with Friends and Family:    Frequency of Social Gatherings with Friends and Family:    Attends Religious Services:    Active Member of Clubs or Organizations:    Attends Banker Meetings:    Marital Status:    Lives in a 55 year old home. 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  Environmental HistorySurveyor, minerals in the house: no Engineer, civil (consulting) in the family room: yes Carpet in the bedroom: yes Heating: gas Cooling: central Pet: yes 3 cats x 13 yrs, 9 yrs  Family History: Family History  Problem Relation Age of Onset   Breast cancer Mother    Breast cancer Maternal Aunt    Problem                               Relation Asthma                                   Sister  Eczema                                No  Food allergy                          No  Allergic rhino conjunctivitis     Father   Review of Systems  Constitutional: Negative for appetite change, chills, fever and unexpected weight change.  HENT: Positive for congestion and sinus pressure. Negative for rhinorrhea.        Ear fullness  Eyes: Negative for itching.  Respiratory:  Negative for cough, chest tightness, shortness of breath and wheezing.   Cardiovascular: Negative for chest pain.  Gastrointestinal: Negative for abdominal pain.  Genitourinary: Negative for difficulty urinating.  Skin: Negative for rash.  Allergic/Immunologic: Positive for environmental allergies.  Neurological: Negative for headaches.   Objective: BP 130/80    Pulse 87    Resp 18    Ht 5' 5.5" (1.664 m)    Wt 222 lb 6.4 oz (100.9 kg)    SpO2 97%    BMI 36.45 kg/m  Body mass index is 36.45 kg/m. Physical Exam Vitals and nursing note reviewed.  Constitutional:      Appearance: Normal appearance. She is well-developed.  HENT:     Head: Normocephalic and atraumatic.     Right Ear: Tympanic membrane and external ear normal.     Left Ear: Tympanic  membrane and external ear normal.     Nose: Nose normal.     Mouth/Throat:     Mouth: Mucous membranes are moist.     Pharynx: Oropharynx is clear.  Eyes:     Conjunctiva/sclera: Conjunctivae normal.  Cardiovascular:     Rate and Rhythm: Normal rate and regular rhythm.     Heart sounds: Normal heart sounds. No murmur heard.  No friction rub. No gallop.   Pulmonary:     Effort: Pulmonary effort is normal.     Breath sounds: Normal breath sounds. No wheezing, rhonchi or rales.  Musculoskeletal:     Cervical back: Neck supple.  Skin:    General: Skin is warm.     Findings: No rash.  Neurological:     Mental Status: She is alert and oriented to person, place, and time.  Psychiatric:        Mood and Affect: Mood normal.        Behavior: Behavior normal.    The plan was reviewed with the patient/family, and all questions/concerned were addressed.  It was my pleasure to see Pasha today and participate in her care. Please feel free to contact me with any questions or concerns.  Sincerely,  Wyline Mood, DO Allergy & Immunology  [facility] [place] office: [phone] [facility] office: [phone] [place] office: [phone]

## 2020-08-25 ENCOUNTER — Ambulatory Visit: Payer: 59 | Admitting: Allergy

## 2021-09-30 ENCOUNTER — Encounter (HOSPITAL_BASED_OUTPATIENT_CLINIC_OR_DEPARTMENT_OTHER): Payer: Self-pay | Admitting: Nurse Practitioner

## 2021-09-30 ENCOUNTER — Ambulatory Visit (HOSPITAL_BASED_OUTPATIENT_CLINIC_OR_DEPARTMENT_OTHER): Payer: BC Managed Care – PPO | Admitting: Nurse Practitioner

## 2021-09-30 ENCOUNTER — Other Ambulatory Visit: Payer: Self-pay

## 2021-09-30 VITALS — BP 140/100 | HR 72 | Ht 65.0 in | Wt 214.0 lb

## 2021-09-30 DIAGNOSIS — F411 Generalized anxiety disorder: Secondary | ICD-10-CM | POA: Diagnosis not present

## 2021-09-30 DIAGNOSIS — Z7689 Persons encountering health services in other specified circumstances: Secondary | ICD-10-CM

## 2021-09-30 DIAGNOSIS — I10 Essential (primary) hypertension: Secondary | ICD-10-CM

## 2021-09-30 DIAGNOSIS — Z0000 Encounter for general adult medical examination without abnormal findings: Secondary | ICD-10-CM | POA: Insufficient documentation

## 2021-09-30 DIAGNOSIS — E039 Hypothyroidism, unspecified: Secondary | ICD-10-CM

## 2021-09-30 HISTORY — DX: Persons encountering health services in other specified circumstances: Z76.89

## 2021-09-30 MED ORDER — HYDROCHLOROTHIAZIDE 25 MG PO TABS: 25.0000 mg | ORAL_TABLET | Freq: Every day | ORAL | 3 refills | Status: DC

## 2021-09-30 NOTE — Assessment & Plan Note (Signed)
History of apparent hypothyroidism with previous treatment on levothyroxine. She is unsure of the exact diagnosis or reason that she was taking the medication. It appears that she has not taken this in several years. We will obtain labs today for further evaluation and make recommendations based on results. Exacerbation of hypothyroidism could be contributing to her anxiety symptoms therefore I do feel that starting treatment as soon as possible would be beneficial for her.

## 2021-09-30 NOTE — Assessment & Plan Note (Signed)
Generalized anxiety disorder present related to current situation and stressors at work. Discussed the concerns with patient at length today as well as medication recommendations that can help with symptom management during this difficult. Also discussed the option of counseling services.  She is concerned that she will not have time for formal counseling services but will consider medication. Information provided for her to reading. We will plan to follow-up in 4 weeks for blood pressure and we can discuss medications at that time she is aware that she can contact me sooner to discuss starting medication before then.

## 2021-09-30 NOTE — Assessment & Plan Note (Signed)
Blood pressure is elevated in the office today.  Initial check 140/100.  Recheck approximately 15 minutes later showed 138/98. Given the patient's history of elevated blood pressure readings we will begin medication treatment today. Discussed with patient the importance of consistency with treatment. Will obtain labs today. HCTZ 25 mg once a day started today we will evaluate in approximately 4 weeks.

## 2021-09-30 NOTE — Assessment & Plan Note (Signed)
New patient to establish care with the practice today. She has not had a PCP in quite some time therefore no records are available to review. We will obtain baseline labs today and work on getting her caught up on her health maintenance activities. Will order mammo and Cologuard today. Patient provided with information on sertraline and Lexapro for anxiety symptoms. Will start HCTZ for high blood pressure. We will plan to follow-up in 4 weeks for blood pressure and in the near future for CPE.

## 2021-09-30 NOTE — Progress Notes (Signed)
Tollie Eth, DNP, AGNP-c Primary Care & Sports Medicine [address]  [address] [place], [place] [address] [phone]  New patient visit   Patient: Mary Horn   DOB: 10-09-1966   55 y.o. Female  MRN: [id number] Visit Date: 09/30/2021  Patient Care Team: Hatim Homann, Sung Amabile, NP as PCP - General (Nurse Practitioner)  Today's healthcare provider: Tollie Eth, NP   Chief Complaint  Patient presents with   New Patient (Initial Visit)    Patient is here to establish care. She is concerned about her BP. Her BP is evaluated today. She has severe anxiety. No labs have been done in over 10 + years. Bladder issues have subsided patient states she thinks she had a kidney stone and passed it.    Subjective    Mary Horn is a 55 y.o. female who presents today as a new patient to establish care.  HPI  Mary Horn presents today with concerns for elevated blood pressure readings and to obtain a PCP. She reports that over the past several years she has not had a PCP and has not been keeping up on her health.  She has recently made a commitment to keep up on her health and is establishing care today. She was having symptoms of pain in her bladder yesterday however she feels that she was passing a kidney stone and her symptoms have completely resolved. She reports this is happened in the past and she is aware of the symptoms. She has no concerns at this time.  She reports she has had some significant increase stress with work lately due to significant deadlines and trying to complete several months of work within a several week timeframe without assistance. She states that she has been only sleeping about 4 hours at night and working all other hours of the day to try to get this completed. She reports that due to this she has been extremely emotional recently and really feels like her anxiety symptoms are through the roof.  She reports over the years she has had some high blood  pressure readings when she would see a provider for 1 reason or another but they typically brushed this off as "white coat" syndrome.  She has been checking her blood pressure had various pharmacies recently and noticed that it is running in the higher range and this has her concerned.  In the past she was on thyroid medications at 1 point but she is unsure why she was off the medication and she has stopped taking that.  Overall she feels her health could be better.  She typically does not eat breakfast and feels that she eats too many processed foods.  She does typically eat lean cuisine or similar meals and "not nearly enough veggies".  She also tells me that she does not routinely exercise.  History reviewed. No pertinent past medical history. History reviewed. No pertinent surgical history. Family Status  Relation Name Status   Mother  (Not Specified)   Mat Aunt  (Not Specified)   Family History  Problem Relation Age of Onset   Breast cancer Mother    Breast cancer Maternal Aunt    Social History   Socioeconomic History   Marital status: Single    Spouse name: Not on file   Number of children: Not on file   Years of education: Not on file   Highest education level: Not on file  Occupational History   Not on file  Tobacco Use  Smoking status: Never   Smokeless tobacco: Never  Vaping Use   Vaping Use: Never used  Substance and Sexual Activity   Alcohol use: Yes    Comment: occ   Drug use: Yes    Types: Marijuana    Comment: Occasionally   Sexual activity: Not on file  Other Topics Concern   Not on file  Social History Narrative   Not on file   Social Determinants of Health   Financial Resource Strain: Not on file  Food Insecurity: Not on file  Transportation Needs: Not on file  Physical Activity: Not on file  Stress: Not on file  Social Connections: Not on file   Outpatient Medications Prior to Visit  Medication Sig   [DISCONTINUED] fluticasone (FLONASE) 50  MCG/ACT nasal spray Place 1 spray into the nose in the morning and at bedtime. (Patient not taking: Reported on 09/30/2021)   [DISCONTINUED] levothyroxine (SYNTHROID) 50 MCG tablet Take 1 tablet (50 mcg total) by mouth daily before breakfast. (Patient not taking: Reported on 07/30/2019)   [DISCONTINUED] LORazepam (ATIVAN) 0.5 MG tablet Take 1 tablet (0.5 mg total) by mouth 2 (two) times daily as needed for anxiety. (Patient not taking: Reported on 09/03/2018)   [DISCONTINUED] nabumetone (RELAFEN) 750 MG tablet Take 1 tablet (750 mg total) by mouth 2 (two) times daily as needed. (Patient not taking: Reported on 09/30/2021)   [DISCONTINUED] tiZANidine (ZANAFLEX) 2 MG tablet Take 1-2 tablets (2-4 mg total) by mouth at bedtime as needed for muscle spasms. (Patient not taking: Reported on 09/30/2021)   [DISCONTINUED] ZYRTEC-D ALLERGY & CONGESTION 5-120 MG tablet Take 1 tablet by mouth 2 (two) times daily. (Patient not taking: Reported on 09/30/2021)   No facility-administered medications prior to visit.   No Known Allergies   There is no immunization history on file for this patient.  Health Maintenance  Topic Date Due   COVID-19 Vaccine (1) Never done   HIV Screening  Never done   Hepatitis C Screening  Never done   TETANUS/TDAP  Never done   PAP SMEAR-Modifier  Never done   COLONOSCOPY (Pts 45-52yrs Insurance coverage will need to be confirmed)  Never done   Zoster Vaccines- Shingrix (1 of 2) Never done   MAMMOGRAM  09/04/2020   INFLUENZA VACCINE  Never done   Pneumococcal Vaccine 71-80 Years old  Aged Out   HPV VACCINES  Aged Out    Patient Care Team: Robey Massmann, Sung Amabile, NP as PCP - General (Nurse Practitioner)  Review of Systems All review of systems negative except what is listed in the HPI    Objective    BP (!) 140/100   Pulse 72   Ht 5\' 5"  (1.651 m)   Wt 214 lb (97.1 kg)   SpO2 98%   BMI 35.61 kg/m  Physical Exam Vitals and nursing note reviewed.  Constitutional:       General: She is not in acute distress.    Appearance: Normal appearance.  Eyes:     Extraocular Movements: Extraocular movements intact.     Conjunctiva/sclera: Conjunctivae normal.     Pupils: Pupils are equal, round, and reactive to light.  Neck:     Vascular: No carotid bruit.  Cardiovascular:     Rate and Rhythm: Normal rate and regular rhythm.     Pulses: Normal pulses.     Heart sounds: Normal heart sounds. No murmur heard. Pulmonary:     Effort: Pulmonary effort is normal.     Breath sounds: Normal  breath sounds. No wheezing.  Abdominal:     General: Bowel sounds are normal.     Palpations: Abdomen is soft.  Musculoskeletal:        General: Normal range of motion.     Cervical back: Normal range of motion.     Right lower leg: No edema.     Left lower leg: No edema.  Skin:    General: Skin is warm and dry.     Capillary Refill: Capillary refill takes less than 2 seconds.  Neurological:     General: No focal deficit present.     Mental Status: She is alert and oriented to person, place, and time.  Psychiatric:        Mood and Affect: Mood normal.        Behavior: Behavior normal.        Thought Content: Thought content normal.        Judgment: Judgment normal.     Depression Screen PHQ 2/9 Scores 09/30/2021  PHQ - 2 Score 2  PHQ- 9 Score 16   No results found for any visits on 09/30/21.  Assessment & Plan      Problem List Items Addressed This Visit     Encounter to establish care - Primary    New patient to establish care with the practice today. She has not had a PCP in quite some time therefore no records are available to review. We will obtain baseline labs today and work on getting her caught up on her health maintenance activities. Will order mammo and Cologuard today. Patient provided with information on sertraline and Lexapro for anxiety symptoms. Will start HCTZ for high blood pressure. We will plan to follow-up in 4 weeks for blood pressure and  in the near future for CPE.      Relevant Orders   CBC with Differential/Platelet   Comprehensive metabolic panel   Lipid panel   Hemoglobin A1c   TSH   VITAMIN D 25 Hydroxy (Vit-D Deficiency, Fractures)   Generalized anxiety disorder    Generalized anxiety disorder present related to current situation and stressors at work. Discussed the concerns with patient at length today as well as medication recommendations that can help with symptom management during this difficult. Also discussed the option of counseling services.  She is concerned that she will not have time for formal counseling services but will consider medication. Information provided for her to reading. We will plan to follow-up in 4 weeks for blood pressure and we can discuss medications at that time she is aware that she can contact me sooner to discuss starting medication before then.      Relevant Orders   CBC with Differential/Platelet   Comprehensive metabolic panel   Lipid panel   Hemoglobin A1c   TSH   VITAMIN D 25 Hydroxy (Vit-D Deficiency, Fractures)   Primary hypertension    Blood pressure is elevated in the office today.  Initial check 140/100.  Recheck approximately 15 minutes later showed 138/98. Given the patient's history of elevated blood pressure readings we will begin medication treatment today. Discussed with patient the importance of consistency with treatment. Will obtain labs today. HCTZ 25 mg once a day started today we will evaluate in approximately 4 weeks.      Relevant Orders   CBC with Differential/Platelet   Comprehensive metabolic panel   Lipid panel   Hemoglobin A1c   TSH   VITAMIN D 25 Hydroxy (Vit-D Deficiency, Fractures)   Hypothyroidism  History of apparent hypothyroidism with previous treatment on levothyroxine. She is unsure of the exact diagnosis or reason that she was taking the medication. It appears that she has not taken this in several years. We will obtain labs  today for further evaluation and make recommendations based on results. Exacerbation of hypothyroidism could be contributing to her anxiety symptoms therefore I do feel that starting treatment as soon as possible would be beneficial for her.        Return in about 4 weeks (around 10/28/2021) for Virtual HTN.    Time: 40 minutes, >50% spent counseling, care coordination, chart review, and documentation.    Japneet Staggs, Sung Amabile, NP, DNP, AGNP-C Primary Care & Sports Medicine at [facility]

## 2021-09-30 NOTE — Patient Instructions (Signed)
Thank you for choosing [place] at [facility] for your Primary Care needs. I am excited for the opportunity to partner with you to meet your health care goals. It was a pleasure meeting you today!  Recommendations from today's visit: We will let you know if there are any concerning findings with your labs  I will order the mammogram, they will call you to schedule this I will order the cologuard, they will mail this to your home We will follow-up in about 4 weeks with a video visit to see how your BP is doing.   Information on diet, exercise, and health maintenance recommendations are listed below. This is information to help you be sure you are on track for optimal health and monitoring.   Please look over this and let us know if you have any questions or if you have completed any of the health maintenance outside of [place] so that we can be sure your records are up to date.  ___________________________________________________________ About Me: I am an Adult-Geriatric Nurse Practitioner with a background in caring for patients for more than 20 years with a strong intensive care background. I provide primary care and sports medicine services to patients age 56 and older within this office. My education had a strong focus on caring for the older adult population, which I am passionate about. I am also the director of the APP Fellowship with [facility].   My desire is to provide you with the best service through preventive medicine and supportive care. I consider you a part of the medical team and value your input. I work diligently to ensure that you are heard and your needs are met in a safe and effective manner. I want you to feel comfortable with me as your provider and want you to know that your health concerns are important to me.  For your information, our office hours are: Monday, Tuesday, and Thursday 8:00 AM - 5:00 PM Wednesday and Friday 8:00 AM - 12:00 PM.    In my time away from the office I am teaching new APP's within the system and am unavailable, but my partner, Dr. Burnard Bunting is in the office for emergent needs.   If you have questions or concerns, please call our office at [phone] or send [place] a MyChart message and we will respond as quickly as possible.  ____________________________________________________________ MyChart:  For all urgent or time sensitive needs we ask that you please call the office to avoid delays. Our number is [phone]. MyChart is not constantly monitored and due to the large volume of messages a day, replies may take up to 72 business hours.  MyChart Policy: MyChart allows for you to see your visit notes, after visit summary, provider recommendations, lab and tests results, make an appointment, request refills, and contact your provider or the office for non-urgent questions or concerns. Providers are seeing patients during normal business hours and do not have built in time to review MyChart messages.  We ask that you allow a minimum of 3 business days for responses to [employer]. For this reason, please do not send urgent requests through [place]. Please call the office at [phone]. New and ongoing conditions may require a visit. We have virtual and in person visit available for your convenience.  Complex MyChart concerns may require a visit. Your provider may request you schedule a virtual or in person visit to ensure we are providing the best care possible. MyChart messages sent after  11:00 AM on Friday will not be received by the provider until Monday morning.    Lab and Test Results: You will receive your lab and test results on MyChart as soon as they are completed and results have been sent by the lab or testing facility. Due to this service, you will receive your results BEFORE your provider.  I review lab and tests results each morning prior to seeing patients. Some results require collaboration  with other providers to ensure you are receiving the most appropriate care. For this reason, we ask that you please allow a minimum of 3-5 business days from the time the ALL results have been received for your provider to receive and review lab and test results and contact you about these.  Most lab and test result comments from the provider will be sent through [place]. Your provider may recommend changes to the plan of care, follow-up visits, repeat testing, ask questions, or request an office visit to discuss these results. You may reply directly to this message or call the office at [phone] to provide information for the provider or set up an appointment. In some instances, you will be called with test results and recommendations. Please let us know if this is preferred and we will make note of this in your chart to provide this for you.    If you have not heard a response to your lab or test results in 5 business days from all results returning to [place], please call the office to let us know. We ask that you please avoid calling prior to this time unless there is an emergent concern. Due to high call volumes, this can delay the resulting process.  After Hours: For all non-emergency after hours needs, please call the office at [phone] and select the option to reach the on-call provider service. On-call services are shared between multiple [place] offices and therefore it will not be possible to speak directly with your provider. On-call providers may provide medical advice and recommendations, but are unable to provide refills for maintenance medications.  For all emergency or urgent medical needs after normal business hours, we recommend that you seek care at the closest Urgent Care or Emergency Department to ensure appropriate treatment in a timely manner.  [facility] has a 24 hour emergency room located on the ground floor for your convenience.   Urgent  Concerns During the Business Day Providers are seeing patients from 8AM to [place] with a busy schedule and are most often not able to respond to non-urgent calls until the end of the day or the next business day. If you should have URGENT concerns during the day, please call and speak to the nurse or schedule a same day appointment so that we can address your concern without delay.   Thank you, again, for choosing me as your health care partner. I appreciate your trust and look forward to learning more about you.   Worthy Keeler, DNP, AGNP-c ___________________________________________________________  Health Maintenance Recommendations Screening Testing Mammogram Every 1 -2 years based on history and risk factors Starting at age 34 Pap Smear Ages 21-39 every 3 years Ages 57-65 every 5 years with HPV testing More frequent testing may be required based on results and history Colon Cancer Screening Every 1-10 years based on test performed, risk factors, and history Starting at age 21 Bone Density Screening Every 2-10 years based on history Starting at age 25 for women Recommendations for men differ based on  medication usage, history, and risk factors AAA Screening One time ultrasound Men 60-56 years old who have every smoked Lung Cancer Screening Low Dose Lung CT every 12 months Age 24-80 years with a 30 pack-year smoking history who still smoke or who have quit within the last 15 years  Screening Labs Routine  Labs: Complete Blood Count (CBC), Complete Metabolic Panel (CMP), Cholesterol (Lipid Panel) Every 6-12 months based on history and medications May be recommended more frequently based on current conditions or previous results Hemoglobin A1c Lab Every 3-12 months based on history and previous results Starting at age 17 or earlier with diagnosis of diabetes, high cholesterol, BMI >26, and/or risk factors Frequent monitoring for patients with diabetes to ensure blood sugar  control Thyroid Panel (TSH w/ T3 & T4) Every 6 months based on history, symptoms, and risk factors May be repeated more often if on medication HIV One time testing for all patients 19 and older May be repeated more frequently for patients with increased risk factors or exposure Hepatitis C One time testing for all patients 61 and older May be repeated more frequently for patients with increased risk factors or exposure Gonorrhea, Chlamydia Every 12 months for all sexually active persons 13-24 years Additional monitoring may be recommended for those who are considered high risk or who have symptoms PSA Men 70-57 years old with risk factors Additional screening may be recommended from age 42-69 based on risk factors, symptoms, and history  Vaccine Recommendations Tetanus Booster All adults every 10 years Flu Vaccine All patients 6 months and older every year COVID Vaccine All patients 12 years and older Initial dosing with booster May recommend additional booster based on age and health history HPV Vaccine 2 doses all patients age 59-26 Dosing may be considered for patients over 26 Shingles Vaccine (Shingrix) 2 doses all adults 96 years and older Pneumonia (Pneumovax 23) All adults 53 years and older May recommend earlier dosing based on health history Pneumonia (Prevnar 85) All adults 11 years and older Dosed 1 year after Pneumovax 23  Additional Screening, Testing, and Vaccinations may be recommended on an individualized basis based on family history, health history, risk factors, and/or exposure.  __________________________________________________________  Diet Recommendations for All Patients  I recommend that all patients maintain a diet low in saturated fats, carbohydrates, and cholesterol. While this can be challenging at first, it is not impossible and small changes can make big differences.  Things to try: Decreasing the amount of soda, sweet tea, and/or juice to  one or less per day and replace with water While water is always the first choice, if you do not like water you may consider adding a water additive without sugar to improve the taste other sugar free drinks Replace potatoes with a brightly colored vegetable at dinner Use healthy oils, such as canola oil or olive oil, instead of butter or hard margarine Limit your bread intake to two pieces or less a day Replace regular pasta with low carb pasta options Bake, broil, or grill foods instead of frying Monitor portion sizes  Eat smaller, more frequent meals throughout the day instead of large meals  An important thing to remember is, if you love foods that are not great for your health, you don't have to give them up completely. Instead, allow these foods to be a reward when you have done well. Allowing yourself to still have special treats every once in a while is a nice way to tell yourself thank you for  working hard to keep yourself healthy.   Also remember that every day is a new day. If you have a bad day and "fall off the wagon", you can still climb right back up and keep moving along on your journey!  We have resources available to help you!  Some websites that may be helpful include: www.http://carter.biz/  Www.VeryWellFit.com _____________________________________________________________  Activity Recommendations for All Patients  I recommend that all adults get at least 20 minutes of moderate physical activity that elevates your heart rate at least 5 days out of the week.  Some examples include: Walking or jogging at a pace that allows you to carry on a conversation Cycling (stationary bike or outdoors) Water aerobics Yoga Weight lifting Dancing If physical limitations prevent you from putting stress on your joints, exercise in a pool or seated in a chair are excellent options.  Do determine your MAXIMUM heart rate for activity: YOUR AGE - 220 = MAX HeartRate   Remember! Do not  push yourself too hard.  Start slowly and build up your pace, speed, weight, time in exercise, etc.  Allow your body to rest between exercise and get good sleep. You will need more water than normal when you are exerting yourself. Do not wait until you are thirsty to drink. Drink with a purpose of getting in at least 8, 8 ounce glasses of water a day plus more depending on how much you exercise and sweat.    If you begin to develop dizziness, chest pain, abdominal pain, jaw pain, shortness of breath, headache, vision changes, lightheadedness, or other concerning symptoms, stop the activity and allow your body to rest. If your symptoms are severe, seek emergency evaluation immediately. If your symptoms are concerning, but not severe, please let us know so that we can recommend further evaluation.

## 2021-10-01 LAB — LIPID PANEL
Chol/HDL Ratio: 4.3 ratio (ref 0.0–4.4)
Cholesterol, Total: 217 mg/dL — ABNORMAL HIGH (ref 100–199)
HDL: 51 mg/dL (ref >39)
LDL Chol Calc (NIH): 146 mg/dL — ABNORMAL HIGH (ref 0–99)
Triglycerides: 114 mg/dL (ref 0–149)
VLDL Cholesterol Cal: 20 mg/dL (ref 5–40)

## 2021-10-01 LAB — COMPREHENSIVE METABOLIC PANEL
ALT: 20 IU/L (ref 0–32)
AST: 19 IU/L (ref 0–40)
Albumin/Globulin Ratio: 2.6 — ABNORMAL HIGH (ref 1.2–2.2)
Albumin: 5.1 g/dL — ABNORMAL HIGH (ref 3.8–4.9)
Alkaline Phosphatase: 93 IU/L (ref 44–121)
BUN/Creatinine Ratio: 15 (ref 9–23)
BUN: 13 mg/dL (ref 6–24)
Bilirubin Total: 0.4 mg/dL (ref 0.0–1.2)
CO2: 26 mmol/L (ref 20–29)
Calcium: 9.9 mg/dL (ref 8.7–10.2)
Chloride: 103 mmol/L (ref 96–106)
Creatinine, Ser: 0.86 mg/dL (ref 0.57–1.00)
Globulin, Total: 2 g/dL (ref 1.5–4.5)
Glucose: 99 mg/dL (ref 70–99)
Potassium: 4.6 mmol/L (ref 3.5–5.2)
Sodium: 143 mmol/L (ref 134–144)
Total Protein: 7.1 g/dL (ref 6.0–8.5)
eGFR: 80 mL/min/{1.73_m2} (ref >59)

## 2021-10-01 LAB — TSH: TSH: 4.94 u[IU]/mL — ABNORMAL HIGH (ref 0.450–4.500)

## 2021-10-01 LAB — CBC WITH DIFFERENTIAL/PLATELET
Basophils Absolute: 0.1 10*3/uL (ref 0.0–0.2)
Basos: 1 %
EOS (ABSOLUTE): 0.2 10*3/uL (ref 0.0–0.4)
Eos: 2 %
Hematocrit: 43.5 % (ref 34.0–46.6)
Hemoglobin: 14.3 g/dL (ref 11.1–15.9)
Immature Grans (Abs): 0 10*3/uL (ref 0.0–0.1)
Immature Granulocytes: 0 %
Lymphocytes Absolute: 2.6 10*3/uL (ref 0.7–3.1)
Lymphs: 37 %
MCH: 29.1 pg (ref 26.6–33.0)
MCHC: 32.9 g/dL (ref 31.5–35.7)
MCV: 89 fL (ref 79–97)
Monocytes Absolute: 0.5 10*3/uL (ref 0.1–0.9)
Monocytes: 8 %
Neutrophils Absolute: 3.8 10*3/uL (ref 1.4–7.0)
Neutrophils: 52 %
Platelets: 330 10*3/uL (ref 150–450)
RBC: 4.91 x10E6/uL (ref 3.77–5.28)
RDW: 13.2 % (ref 11.7–15.4)
WBC: 7.2 10*3/uL (ref 3.4–10.8)

## 2021-10-01 LAB — HEMOGLOBIN A1C
Est. average glucose Bld gHb Est-mCnc: 126 mg/dL
Hgb A1c MFr Bld: 6 % — ABNORMAL HIGH (ref 4.8–5.6)

## 2021-10-01 LAB — VITAMIN D 25 HYDROXY (VIT D DEFICIENCY, FRACTURES): Vit D, 25-Hydroxy: 25.1 ng/mL — ABNORMAL LOW (ref 30.0–100.0)

## 2021-10-06 ENCOUNTER — Encounter (HOSPITAL_BASED_OUTPATIENT_CLINIC_OR_DEPARTMENT_OTHER): Payer: Self-pay

## 2021-10-28 ENCOUNTER — Encounter (HOSPITAL_BASED_OUTPATIENT_CLINIC_OR_DEPARTMENT_OTHER): Payer: Self-pay | Admitting: Nurse Practitioner

## 2021-10-28 ENCOUNTER — Telehealth (INDEPENDENT_AMBULATORY_CARE_PROVIDER_SITE_OTHER): Payer: BC Managed Care – PPO | Admitting: Nurse Practitioner

## 2021-10-28 DIAGNOSIS — E038 Other specified hypothyroidism: Secondary | ICD-10-CM

## 2021-10-28 DIAGNOSIS — I10 Essential (primary) hypertension: Secondary | ICD-10-CM

## 2021-10-28 DIAGNOSIS — E559 Vitamin D deficiency, unspecified: Secondary | ICD-10-CM

## 2021-10-28 DIAGNOSIS — R7303 Prediabetes: Secondary | ICD-10-CM | POA: Diagnosis not present

## 2021-10-28 MED ORDER — LEVOTHYROXINE SODIUM 25 MCG PO TABS: 25.0000 ug | ORAL_TABLET | Freq: Every day | ORAL | 3 refills | Status: DC

## 2021-10-28 MED ORDER — HYDROCHLOROTHIAZIDE 25 MG PO TABS: 25.0000 mg | ORAL_TABLET | Freq: Every day | ORAL | 3 refills | Status: DC

## 2021-10-28 MED ORDER — VITAMIN D (ERGOCALCIFEROL) 1.25 MG (50000 UNIT) PO CAPS: 50000.0000 [IU] | ORAL_CAPSULE | ORAL | 0 refills | Status: DC

## 2021-10-28 NOTE — Assessment & Plan Note (Signed)
A1c elevated to 6.0% with last labs.  Lengthy discussion on pathology of diabetes and the effects this disease can have on cardiovascular health, weight, infection, and organs.  She is currently working on improving her diet and increasing her activity levels.  Education on metformin and GLP-1 medications to help manage and prevent progression to DM2.  At this time, she would like to hold off on starting medication until she can see what her lifestyle changes do for the blood sugar levels.  She would like to have her levels rechecked in 8 weeks with other labs to evaluate her progress and determine at that time if she would like to start medication.

## 2021-10-28 NOTE — Assessment & Plan Note (Signed)
Doing well with medication compliance. Only a few doses missed while sick. Unable to verify blood pressure today, but we will plan to check when she comes in for labs in 8 weeks and monitor.  At this time, recommend continue with current dosing.  No alarm symptoms present.  Her last labs showed good kidney function and electrolytes.  She is working on diet and exercise, which should help.

## 2021-10-28 NOTE — Assessment & Plan Note (Signed)
Recommend weekly supplementation with 50,000iU Vitamin D3 for 8 weeks then transition to Vitamin D3 over the counter 800-1000iU daily.  We will plan to recheck labs in 8 weeks and see if additional treatment is required.

## 2021-10-28 NOTE — Progress Notes (Signed)
Virtual Visit Encounter telephone visit. Attempted to call patient at [phone] (Mary Horn) for visit. Left VM for her to return call if she is able to complete the visit Mary Horn.   I connected with  Mary Horn on 10/28/21 at  3:50 PM EST by secure audio and/or video enabled telemedicine application. I verified that I am speaking with the correct person using two identifiers.   I introduced myself as a Publishing rights manager with the practice. The limitations of evaluation and management by telemedicine discussed with the patient and the availability of in person appointments. The patient expressed verbal understanding and consent to proceed.  Participating parties in this visit include: Myself and patient  The patient is: Patient Location: Other:  Work I am: Provider Location: Office/Clinic Subjective:    CC and HPI: Mary Horn is a 55 y.o. year old female presenting for follow up of HTN and lab work. HTN Mary Horn reports taking HCTZ and tolerating the medication well with no side effects.  No headaches, dizziness, CP, palpitations.  She had a cold for several days and did miss a couple of doses of medication due to concerns of dehydration, but she has resumed taking this regularly.  She has not been able to check her blood pressure as her home machine is not working properly. She plans to get another machine so she can do this.   Labs Recent lab work showed irregularities with thyroid function, cholesterol, vitamin D, and blood sugar.   Hypothyroidism Remote history of thyroid dysfunction with use of levothyroxine, however, this medication was stopped many years ago as it was not changing the levels as expected. Marjorie endorses dry skin, anxiety, constipation, fatigue, weight gain, and hair loss in the past months to years.  She has not noticed palpitations. She is willing to restart thyroid medication to see if this helps with the symptoms.   Hyperlipidemia Elevated LDL on recent labs Not currently  taking any medication for this.  She reports she is working on improving her diet and activity levels with hopes to become more healthy overall.  She would like to monitor her progress over the next few weeks and then decide whether medication start would be required.   Vitamin D History of low vitamin D levels with no prescription supplementation in the past.  She has had fatigue lately and does have concerns about her bone health as she ages  Pre-Diabetes Recent A1c at 6.0%. She is not aware of any history of prediabetes.  She does tell me that in her Eean Buss 20's she was hospitalized for testing for suspected insulin producing tumor, however, the tests were lost and she did not want to have to return for retesting.  She has had weight gain, fatigue, and cravings.  She has not noticed any paresthesias, increased thirst, or increased urination.    Past medical history, Surgical history, Family history not pertinant except as noted below, Social history, Allergies, and medications have been entered into the medical record, reviewed, and corrections made.   Review of Systems:  All review of systems negative except what is listed in the HPI  Objective:    Alert and oriented x 4 Speaking in clear sentences with no shortness of breath. No distress.  Impression and Recommendations:    Problem List Items Addressed This Visit     Primary hypertension - Primary    Doing well with medication compliance. Only a few doses missed while sick. Unable to verify blood pressure today, but we will plan  to check when she comes in for labs in 8 weeks and monitor.  At this time, recommend continue with current dosing.  No alarm symptoms present.  Her last labs showed good kidney function and electrolytes.  She is working on diet and exercise, which should help.       Relevant Medications   hydrochlorothiazide (HYDRODIURIL) 25 MG tablet   Hypothyroidism    TSH slightly elevated, but patient is  symptomatic at this time.  Joint decision to begin levothyroxine . Instructions to take every morning on an empty stomach and wait 30 minutes before additional intake.  We will plan to recheck labs in 8 weeks.       Relevant Medications   levothyroxine (SYNTHROID) 25 MCG tablet   Pre-diabetes    A1c elevated to 6.0% with last labs.  Lengthy discussion on pathology of diabetes and the effects this disease can have on cardiovascular health, weight, infection, and organs.  She is currently working on improving her diet and increasing her activity levels.  Education on metformin and GLP-1 medications to help manage and prevent progression to DM2.  At this time, she would like to hold off on starting medication until she can see what her lifestyle changes do for the blood sugar levels.  She would like to have her levels rechecked in 8 weeks with other labs to evaluate her progress and determine at that time if she would like to start medication.        Vitamin D deficiency    Recommend weekly supplementation with 50,000iU Vitamin D3 for 8 weeks then transition to Vitamin D3 over the counter 800-1000iU daily.  We will plan to recheck labs in 8 weeks and see if additional treatment is required.       Relevant Medications   Vitamin D, Ergocalciferol, (DRISDOL) 1.25 MG (50000 UNIT) CAPS capsule    orders and follow up as documented in EMR, lab results reviewed with patient, repeat labs ordered prior to next appointment, reviewed compliance with lifestyle measures, reviewed diet, exercise and weight control, reviewed medications and side effects in detail I discussed the assessment and treatment plan with the patient. The patient was provided an opportunity to ask questions and all were answered. The patient agreed with the plan and demonstrated an understanding of the instructions.   The patient was advised to call back or seek an in-person evaluation if the symptoms worsen or if the  condition fails to improve as anticipated.  Follow-Up: in 8 weeks  I provided 55 minutes of non-face-to-face interaction with this non face-to-face encounter including intake, same-day documentation, and chart review.   Tollie Eth, NP , DNP, AGNP-c [facility] Primary [facility] Sports Medicine at [facility] [phone] (fax)

## 2021-10-28 NOTE — Assessment & Plan Note (Signed)
TSH slightly elevated, but patient is symptomatic at this time.  Joint decision to begin levothyroxine . Instructions to take every morning on an empty stomach and wait 30 minutes before additional intake.  We will plan to recheck labs in 8 weeks.

## 2021-10-28 NOTE — Patient Instructions (Signed)
Hypothyroidism I have sent in medication for hypothyroidism.  This medication is called levothyroxine .  It is taken one time a day with water on an empty stomach.  Wait 30 minutes to 1 hour before eating or taking other medications to allow the medication to absorb properly.  We will plan to recheck your labs in 8 weeks.   Vitamin D Deficiency I have sent in medication for Vitamin D Deficiency This medication is called Vitamin D3 50,000iU You take this medication once a week for 8 weeks.  You may take this medication any time of day and with or without food.  When you finish with the prescription, I recommend taking Vitamin D3 over the counter 800-1000iU daily to help keep the numbers up  We will plan to recheck your labs in about 8 weeks  Pre-Diabetes and Hyperlipidemia I would like you to work on diet and activity to help reduce your blood sugar levels and cholesterol.  Your goal is to have no more than 150 grams of carbohydrates a day.  Your goal is to have no more than 13 grams of saturated fat a day.  Your goal is to increase your fiber to at least 30 grams a day.  Carbohydrates turn to sugar in the body and are found in many foods we eat. Saturated fat is typically found in fats that are solid at room temperature (animal fat, lard, etc) I recommend looking on the internet to find the amount of carbohydrates and saturated fat in your favorite foods to help plan your meals to stay in the recommended range.  Most people find that they do not get enough fiber in their diets. An easy way to increase fiber is through supplements such as FiberCon or Metamucil.   Please let me know if you have any questions.

## 2022-04-14 DIAGNOSIS — H5203 Hypermetropia, bilateral: Secondary | ICD-10-CM | POA: Diagnosis not present

## 2022-04-14 DIAGNOSIS — H524 Presbyopia: Secondary | ICD-10-CM | POA: Diagnosis not present

## 2022-04-14 DIAGNOSIS — H1789 Other corneal scars and opacities: Secondary | ICD-10-CM | POA: Diagnosis not present

## 2022-06-30 ENCOUNTER — Ambulatory Visit (HOSPITAL_BASED_OUTPATIENT_CLINIC_OR_DEPARTMENT_OTHER): Payer: BC Managed Care – PPO | Admitting: Nurse Practitioner

## 2022-06-30 ENCOUNTER — Encounter (HOSPITAL_BASED_OUTPATIENT_CLINIC_OR_DEPARTMENT_OTHER): Payer: Self-pay | Admitting: Nurse Practitioner

## 2022-06-30 VITALS — BP 148/90 | HR 68 | Ht 65.0 in | Wt 210.0 lb

## 2022-06-30 DIAGNOSIS — Z1231 Encounter for screening mammogram for malignant neoplasm of breast: Secondary | ICD-10-CM

## 2022-06-30 DIAGNOSIS — I10 Essential (primary) hypertension: Secondary | ICD-10-CM

## 2022-06-30 DIAGNOSIS — H8103 Meniere's disease, bilateral: Secondary | ICD-10-CM

## 2022-06-30 DIAGNOSIS — H60312 Diffuse otitis externa, left ear: Secondary | ICD-10-CM

## 2022-06-30 MED ORDER — MECLIZINE HCL 25 MG PO TABS: 25.0000 mg | ORAL_TABLET | Freq: Three times a day (TID) | ORAL | 3 refills | Status: DC | PRN

## 2022-06-30 MED ORDER — DIAZEPAM 5 MG PO TABS: 5.0000 mg | ORAL_TABLET | Freq: Two times a day (BID) | ORAL | 1 refills | Status: DC | PRN

## 2022-06-30 MED ORDER — BLOOD PRESSURE KIT: PACK | 0 refills | Status: AC

## 2022-06-30 MED ORDER — CIPROFLOXACIN-DEXAMETHASONE 0.3-0.1 % OT SUSP: 4.0000 [drp] | Freq: Two times a day (BID) | OTIC | 0 refills | Status: DC

## 2022-06-30 NOTE — Patient Instructions (Addendum)
I have sent in a medication called Meclizine that you can take three times a day at the first sign of vertigo. Continue this during the episode.   If the symptoms get very severe, I have sent in the Valium for you to use. This will make you sleepy so use caution when taking this.   I have sent in drops for the ear infection. I would use this for at least 7 days. Put 4 drops in the ear and then place a cotton ball in the ear to help keep the medication in place.   I have sent the referral to the ENT, we can always see if they have other suggestions.   I want you to check your BP at home. If it is consistently running higher than 135/85 I do want to make some changes.    We will plan to follow up in 2 weeks with a phone call or virtual visit to see how BP is doing and talk about weight loss medications.

## 2022-06-30 NOTE — Progress Notes (Signed)
Worthy Keeler, DNP, AGNP-c [address] [place], [place] [address] [phone] Office [phone] Fax  ESTABLISHED PATIENT- Chronic Health and/or Follow-Up Visit  Blood pressure (!) 148/90, pulse 68, height _0  (1.651 m), weight 210 lb (95.3 kg), SpO2 99 %.  Acute Visit (Patient presents today for vertigo going on x 3 weeks. Dizzy and nausea. She has been diagnosed with meniere's 2022. Questions regarding shingles shot. )   HPI  Mary Horn  is a 56 y.o. year old female presenting today for evaluation and management of the following: Vertigo She endorses she has spent 2 days in bed with severe episodes of room spinning sensation.  She also endorses episodes of emesis without warning She reports her head feels fill of fluid and tinnitus is present  She tells me that her symptoms are so severe she has been noon-functional since onset. She denies any weakness on one side, difficulty speaking or swallowing, confusion or severe headache.   ROS All ROS negative with exception of what is listed in HPI  PHYSICAL EXAM Physical Exam Vitals and nursing note reviewed.  Constitutional:      Appearance: She is ill-appearing.  HENT:     Head: Normocephalic.     Ears:     Comments: Left sided inflammation of auditory canal with thick, white exudate present. Left TM not visible. Right TM shows presence of effusion.     Nose: Nose normal.     Mouth/Throat:     Mouth: Mucous membranes are moist.     Pharynx: No oropharyngeal exudate or posterior oropharyngeal erythema.  Eyes:     General: Lids are normal. Vision grossly intact. No visual field deficit.    Extraocular Movements:     Right eye: Nystagmus present.     Left eye: Nystagmus present.     Comments: Positive dix hall-pike maneuver.   Cardiovascular:     Rate and Rhythm: Normal rate and regular rhythm.     Pulses: Normal pulses.     Heart sounds: Normal heart  sounds.  Pulmonary:     Effort: Pulmonary effort is normal.     Breath sounds: Normal breath sounds.  Musculoskeletal:     Cervical back: Normal range of motion.  Lymphadenopathy:     Cervical: No cervical adenopathy.  Skin:    General: Skin is warm and dry.     Capillary Refill: Capillary refill takes less than 2 seconds.  Neurological:     Mental Status: She is alert and oriented to person, place, and time.     Cranial Nerves: No cranial nerve deficit.     Sensory: No sensory deficit.     Deep Tendon Reflexes: Reflexes normal.  Psychiatric:        Mood and Affect: Mood normal.        Behavior: Behavior normal.        Thought Content: Thought content normal.        Judgment: Judgment normal.     ASSESSMENT & PLAN 1. Meniere's disease of both ears Severe episode of vertigo-like symptoms accompanied by fullness and tinnitus in the ears and episodes of emesis without warning. Sx consistent with Meniere's disease in the setting of bilateral otitis extrema and internal effusion. She is clearly feeling very unwell at this time. Recommend meclizine for mild vertigo symptoms, if not effective may take valium. No driving or activities that require focus/attention while taking valium. Will also send referral to ENT for evaluation  given the severity of symptoms. No alarm symptoms present. Emergency sx discussed and recommendation for f/u provided.  - meclizine (ANTIVERT) 25 MG tablet; Take 1 tablet (25 mg total) by mouth 3 (three) times daily as needed for dizziness or nausea. Take at the first sign of vertigo symptoms  Dispense: 60 tablet; Refill: 3 - diazepam (VALIUM) 5 MG tablet; Take 1 tablet (5 mg total) by mouth every 12 (twelve) hours as needed (severe vertigo).  Dispense: 20 tablet; Refill: 1 - Ambulatory referral to ENT  2. Acute diffuse otitis externa of left ear Evidence of infection in the left auditory canal with accumulation of thick, white exudate, erythema, and inflammation.  Will treat with ciprodex. If symptoms do not improve with treatment, recommend follow-up.  - ciprofloxacin-dexamethasone (CIPRODEX) OTIC suspension; Place 4 drops into the left ear 2 (two) times daily.  Dispense: 7.5 mL; Refill: 0 - Ambulatory referral to ENT  3. Encounter for screening mammogram for malignant neoplasm of breast Order placed - MM 3D SCREEN BREAST BILATERAL; Future  4. Primary hypertension Blood pressure elevated in office today. Patient is quite ill, which is likely exacerbating. Recommend daily monitoring of BP at home and reporting if readings are > 140/90, particularly once vertigo symptoms have improved. Will monitor closely. F/U in the next couple of weeks to discuss BP - Blood Pressure KIT; One kit for HTN monitoring at home as covered by insurance. ICD-10 I10  Dispense: 1 kit; Refill: 0    FOLLOW-UP Return for 2-3 weeks phone/MC for BP and weight.    Worthy Keeler, DNP, AGNP-c 06/30/2022  8:31 AM

## 2022-07-12 ENCOUNTER — Ambulatory Visit (HOSPITAL_BASED_OUTPATIENT_CLINIC_OR_DEPARTMENT_OTHER): Payer: BC Managed Care – PPO | Admitting: Nurse Practitioner

## 2022-07-14 ENCOUNTER — Ambulatory Visit (HOSPITAL_BASED_OUTPATIENT_CLINIC_OR_DEPARTMENT_OTHER): Payer: BC Managed Care – PPO | Admitting: Nurse Practitioner

## 2022-07-14 ENCOUNTER — Encounter (HOSPITAL_BASED_OUTPATIENT_CLINIC_OR_DEPARTMENT_OTHER): Payer: Self-pay | Admitting: Nurse Practitioner

## 2022-07-14 DIAGNOSIS — E039 Hypothyroidism, unspecified: Secondary | ICD-10-CM

## 2022-07-14 DIAGNOSIS — I10 Essential (primary) hypertension: Secondary | ICD-10-CM | POA: Diagnosis not present

## 2022-07-14 DIAGNOSIS — E559 Vitamin D deficiency, unspecified: Secondary | ICD-10-CM | POA: Diagnosis not present

## 2022-07-14 DIAGNOSIS — H8103 Meniere's disease, bilateral: Secondary | ICD-10-CM

## 2022-07-14 DIAGNOSIS — R7303 Prediabetes: Secondary | ICD-10-CM

## 2022-07-14 DIAGNOSIS — E669 Obesity, unspecified: Secondary | ICD-10-CM

## 2022-07-14 NOTE — Progress Notes (Signed)
Virtual Visit Encounter telephone visit.   I connected with  Mary Horn on 08/13/22 at  8:30 AM EDT by secure audio telemedicine application. I verified that I am speaking with the correct person using two identifiers.   I introduced myself as a Publishing rights manager with the practice. The limitations of evaluation and management by telemedicine discussed with the patient and the availability of in person appointments. The patient expressed verbal understanding and consent to proceed.  Participating parties in this visit include: Myself and patient  The patient is: Patient Location: Home I am: Provider Location: Office/Clinic Subjective:    CC and HPI: Mary Horn is a 56 y.o. year old female presenting for follow up of HTN and weight. Patient reports the following:  HTN - she tells me that it is definitely improved, but is running higher at work as opposed to home.  - she has not any routine headaches, vision changes, dizziness - no more vertigo, no chest pain, or palpitations  Weight - She has not been checking her weight - she has been prioritizing her physical activity and drinking water - she has been monitoring her diet closely  Ears - she does feel some fullness in her ears and is still using flonase and allergy medication.  - feels this is helping - would still like to speak with ENT for evaluation and determine if tubes may be needed due to recurrent fluid and infections or if current plan is appropriate for now  Mammogram in a couple of weeks Will come by for labs in the near future  Past medical history, Surgical history, Family history not pertinant except as noted below, Social history, Allergies, and medications have been entered into the medical record, reviewed, and corrections made.   Review of Systems:  All review of systems negative except what is listed in the HPI  Objective:    Alert and oriented x 4 Speaking in clear sentences with no shortness of  breath. No distress.  Impression and Recommendations:    Problem List Items Addressed This Visit     Obesity (BMI 30-39.9) (Chronic)    Chronic.  She is working on [employer] with diet and exercise control and increasing her water intake.  At this time she has not been checking her weight however she does notice a difference in the way her clothes are fitting which is fantastic.  Recommend continuation.  Labs have been pended.      Relevant Orders   CBC With Diff/Platelet   Comprehensive metabolic panel   Hemoglobin A1c   Lipid panel   Thyroid Panel With TSH   VITAMIN D 25 Hydroxy (Vit-D Deficiency, Fractures)   Primary hypertension    Chronic.  Blood pressure well controlled at home.  There are times at work that it is running higher.  She will continue to monitor and let me know if it is running greater than 140/85 consistently and we will make changes to plan of care as necessary based on those findings.  Labs have been ordered for future.      Relevant Orders   CBC With Diff/Platelet   Comprehensive metabolic panel   Hemoglobin A1c   Lipid panel   Thyroid Panel With TSH   VITAMIN D 25 Hydroxy (Vit-D Deficiency, Fractures)   Hypothyroidism    Chronic.  No changes to medication today.  Labs ordered for future.      Relevant Orders   CBC With Diff/Platelet   Comprehensive metabolic panel  Hemoglobin A1c   Lipid panel   Thyroid Panel With TSH   VITAMIN D 25 Hydroxy (Vit-D Deficiency, Fractures)   Pre-diabetes    Chronic.  She is working on diet and exercise for weight management.  Has not been checking blood sugars but she has been increasing her water and physical activity which is likely to improve this.  Pended labs today.      Relevant Orders   CBC With Diff/Platelet   Comprehensive metabolic panel   Hemoglobin A1c   Lipid panel   Thyroid Panel With TSH   VITAMIN D 25 Hydroxy (Vit-D Deficiency, Fractures)   Vitamin D deficiency    Labs ordered.       Relevant Orders   CBC With Diff/Platelet   Comprehensive metabolic panel   Hemoglobin A1c   Lipid panel   Thyroid Panel With TSH   VITAMIN D 25 Hydroxy (Vit-D Deficiency, Fractures)   Other Visit Diagnoses     Meniere's disease of both ears    -  Primary       orders and follow up as documented in EMR I discussed the assessment and treatment plan with the patient. The patient was provided an opportunity to ask questions and all were answered. The patient agreed with the plan and demonstrated an understanding of the instructions.   The patient was advised to call back or seek an in-person evaluation if the symptoms worsen or if the condition fails to improve as anticipated.  Follow-Up:   I provided 17 minutes of non-face-to-face interaction with this non face-to-face encounter including intake, same-day documentation, and chart review.   Tollie Eth, NP , DNP, AGNP-c [facility] Primary [facility] Sports Medicine at [facility] [phone] (fax)

## 2022-07-25 ENCOUNTER — Ambulatory Visit
Admission: RE | Admit: 2022-07-25 | Discharge: 2022-07-25 | Disposition: A | Discharge status: Home / Self Care | Payer: BC Managed Care – PPO | Source: Ambulatory Visit | Attending: Nurse Practitioner | Admitting: Nurse Practitioner

## 2022-07-25 DIAGNOSIS — Z1231 Encounter for screening mammogram for malignant neoplasm of breast: Secondary | ICD-10-CM

## 2022-07-29 ENCOUNTER — Encounter (HOSPITAL_BASED_OUTPATIENT_CLINIC_OR_DEPARTMENT_OTHER): Payer: Self-pay | Admitting: Nurse Practitioner

## 2022-08-13 ENCOUNTER — Encounter (HOSPITAL_BASED_OUTPATIENT_CLINIC_OR_DEPARTMENT_OTHER): Payer: Self-pay | Admitting: Nurse Practitioner

## 2022-08-13 NOTE — Assessment & Plan Note (Signed)
Chronic.  She is working on diet and exercise for weight management.  Has not been checking blood sugars but she has been increasing her water and physical activity which is likely to improve this.  Pended labs today.

## 2022-08-13 NOTE — Assessment & Plan Note (Signed)
Chronic.  No changes to medication today.  Labs ordered for future.

## 2022-08-13 NOTE — Assessment & Plan Note (Signed)
Chronic.  Blood pressure well controlled at home.  There are times at work that it is running higher.  She will continue to monitor and let me know if it is running greater than 140/85 consistently and we will make changes to plan of care as necessary based on those findings.  Labs have been ordered for future.

## 2022-08-13 NOTE — Assessment & Plan Note (Signed)
Labs ordered.

## 2022-08-13 NOTE — Assessment & Plan Note (Signed)
Chronic.  She is working on [employer] with diet and exercise control and increasing her water intake.  At this time she has not been checking her weight however she does notice a difference in the way her clothes are fitting which is fantastic.  Recommend continuation.  Labs have been pended.

## 2022-09-13 DIAGNOSIS — R42 Dizziness and giddiness: Secondary | ICD-10-CM | POA: Diagnosis not present

## 2022-09-13 DIAGNOSIS — H8102 Meniere's disease, left ear: Secondary | ICD-10-CM | POA: Diagnosis not present

## 2022-09-13 DIAGNOSIS — H9042 Sensorineural hearing loss, unilateral, left ear, with unrestricted hearing on the contralateral side: Secondary | ICD-10-CM | POA: Diagnosis not present

## 2022-12-30 ENCOUNTER — Ambulatory Visit: Payer: BC Managed Care – PPO | Admitting: Family

## 2022-12-30 ENCOUNTER — Encounter: Payer: Self-pay | Admitting: Family

## 2022-12-30 VITALS — BP 152/88 | HR 89 | Temp 97.8°F | Ht 65.0 in | Wt 205.4 lb

## 2022-12-30 DIAGNOSIS — E559 Vitamin D deficiency, unspecified: Secondary | ICD-10-CM

## 2022-12-30 DIAGNOSIS — I10 Essential (primary) hypertension: Secondary | ICD-10-CM

## 2022-12-30 DIAGNOSIS — E038 Other specified hypothyroidism: Secondary | ICD-10-CM | POA: Diagnosis not present

## 2022-12-30 MED ORDER — HYDROCHLOROTHIAZIDE 25 MG PO TABS: 25.0000 mg | ORAL_TABLET | Freq: Every day | ORAL | 3 refills | Status: DC

## 2022-12-30 MED ORDER — HYDROCHLOROTHIAZIDE 25 MG PO TABS: 25.0000 mg | ORAL_TABLET | Freq: Every day | ORAL | 1 refills | Status: DC

## 2022-12-30 NOTE — Patient Instructions (Signed)
Welcome to [facility], It was a pleasure meeting you today!   I will review your lab results via MyChart in a few days. As discussed, I have sent your HCTZ refill to your pharmacy. I will refill your Levothyroxine after I review your labs.  Please schedule a 3 month follow up visit today.    PLEASE NOTE: If you had any LAB tests please let us know if you have not heard back within a few days. You may see your results on MyChart before we have a chance to review them but we will give you a call once they are reviewed by [place]. If we ordered any REFERRALS today, please let us know if you have not heard from their office within the next week.  Let us know through MyChart if you are needing REFILLS, or have your pharmacy send [place] the request. You can also use MyChart to communicate with me or any office staff.  Please try these tips to maintain a healthy lifestyle: It is important that you exercise regularly at least 30 minutes 5 times a week. Think about what you will eat, plan ahead. Choose whole foods, & think  "clean, green, fresh or frozen" over canned, processed or packaged foods which are more sugary, salty, and fatty. 70 to 75% of food eaten should be fresh vegetables and protein. 2-3  meals daily with healthy snacks between meals, but must be whole fruit, protein or vegetables. Aim to eat over a 10 hour period when you are active, for example, 7am to 5pm, and then STOP after your last meal of the day, drinking only water.  Shorter eating windows, 6-8 hours, are showing benefits in heart disease and blood sugar regulation. Drink water every day! Shoot for 64 ounces daily = 8 cups, no other drink is as healthy! Fruit juice is best enjoyed in a healthy way, by EATING the fruit.

## 2022-12-30 NOTE — Assessment & Plan Note (Signed)
Chronic, unstable taking HCTZ 51m qd, BP high today, pt reports she has white coat syndrome but also has high BP at her work, home is usually good, highest around 135/90, mostly 110s/80 checking labs today sending refill advised pt on lifestyle modifications to help lower BP including wt loss, exercise, decreasing stress, low sodium diet, increasing water intake f/u 3 months

## 2022-12-30 NOTE — Progress Notes (Signed)
New Patient Office Visit  Subjective:  Patient ID: Mary Horn, female    DOB: 15-Jul-1966  Age: 57 y.o. MRN: [id number]  CC:  Chief Complaint  Patient presents with   New Patient (Initial Visit)    Medication refills   Hypothyroidism    Recheck labs    Hypertension    HPI Mary Horn presents for establishing care today.  Hypertension: Patient is currently maintained on the following medications for blood pressure: HCTZ 62m qd, reports readings at home pretty good, highest 135/80-90, always high in med clinics & her office. Failed meds include: none.  Patient reports good compliance with blood pressure medications. Patient denies chest pain, headaches, shortness of breath or swelling. Last 3 blood pressure readings in our office are as follows: BP Readings from Last 3 Encounters:  12/30/22 (!) 152/88  06/30/22 (!) 148/90  09/30/21 (!) 140/100   Hypothyroidism: Patient presents today for followup of Hypothyroidism.  Patient reports running out of med about 2 mos ago. Normally good compliance with daily medication.  Patient denies any of the following symptoms: fatigue, cold intolerance, constipation, weight gain or inability to lose weight, muscle weakness, mental slowing, dry hair and skin. Last TSH and free T4: Lab Results  Component Value Date   TSH 4.940 (H) 09/30/2021   TSH 13.04 (H) 09/03/2018   TSH 4.873 (H) 07/07/2015     Assessment & Plan:   Problem List Items Addressed This Visit       Cardiovascular and Mediastinum   Primary hypertension    Chronic, unstable taking HCTZ 267mqd, BP high today, pt reports she has white coat syndrome but also has high BP at her work, home is usually good, highest around 135/90, mostly 110s/80 checking labs today sending refill advised pt on lifestyle modifications to help lower BP including wt loss, exercise, decreasing stress, low sodium diet, increasing water intake f/u 3 months      Relevant Medications    hydrochlorothiazide (HYDRODIURIL) 25 MG tablet   Other Relevant Orders   Lipid panel   Comp Met (CMET)     Endocrine   Hypothyroidism    chronic, no labs in over a year, ran out of med months ago denies feeling any sx was taking 2510mof Levothyroxine rechecking labs today will send refill after review of labs f/u 54m-89m      Relevant Orders   TSH   T4, free     Other   Vitamin D deficiency - Primary    chronic level slightly low over a year ago given weekly RX for 3 mos, pt did switch to daily OTC, but was not consistent wants to recheck level today advised safe OTC dose is 2,000units qd, but will see if level is very low & may send RX again      Relevant Orders   Vitamin D (25 hydroxy)    Subjective:    Outpatient Medications Prior to Visit  Medication Sig Dispense Refill   Blood Pressure KIT One kit for HTN monitoring at home as covered by insurance. ICD-10 I10 1 kit 0   cetirizine (ZYRTEC) 10 MG tablet Take 10 mg by mouth daily.     diazepam (VALIUM) 5 MG tablet Take 1 tablet (5 mg total) by mouth every 12 (twelve) hours as needed (severe vertigo). 20 tablet 1   fluticasone (FLONASE) 50 MCG/ACT nasal spray Place into both nostrils daily.     levothyroxine (SYNTHROID) 25 MCG tablet Take 1 tablet (25 mcg total) by  mouth daily. 90 tablet 3   meclizine (ANTIVERT) 25 MG tablet Take 1 tablet (25 mg total) by mouth 3 (three) times daily as needed for dizziness or nausea. Take at the first sign of vertigo symptoms 60 tablet 3   hydrochlorothiazide (HYDRODIURIL) 25 MG tablet Take 1 tablet (25 mg total) by mouth daily. 90 tablet 3   ciprofloxacin-dexamethasone (CIPRODEX) OTIC suspension Place 4 drops into the left ear 2 (two) times daily. 7.5 mL 0   No facility-administered medications prior to visit.   Past Medical History:  Diagnosis Date   Anxiety    Depression    Thyroid disease    Past Surgical History:  Procedure Laterality Date   WISDOM TOOTH EXTRACTION       Objective:   Today's Vitals: BP (!) 152/88 (BP Location: Left Arm, Patient Position: Sitting, Cuff Size: Large)   Pulse 89   Temp 97.8 F (36.6 C) (Temporal)   Ht 5' 5"$  (1.651 m)   Wt 205 lb 6 oz (93.2 kg)   SpO2 95%   BMI 34.18 kg/m   Physical Exam Vitals and nursing note reviewed.  Constitutional:      Appearance: Normal appearance.  Cardiovascular:     Rate and Rhythm: Normal rate and regular rhythm.  Pulmonary:     Effort: Pulmonary effort is normal.     Breath sounds: Normal breath sounds.  Musculoskeletal:        General: Normal range of motion.  Skin:    General: Skin is warm and dry.  Neurological:     Mental Status: She is alert.  Psychiatric:        Mood and Affect: Mood normal.        Behavior: Behavior normal.     Meds ordered this encounter  Medications   DISCONTD: hydrochlorothiazide (HYDRODIURIL) 25 MG tablet    Sig: Take 1 tablet (25 mg total) by mouth daily.    Dispense:  90 tablet    Refill:  3   hydrochlorothiazide (HYDRODIURIL) 25 MG tablet    Sig: Take 1 tablet (25 mg total) by mouth daily.    Dispense:  90 tablet    Refill:  1    *Ignore previous RX just sent, thx    Order Specific Question:   Supervising Provider    Answer:   ANDY, CAMILLE L [[id number]]    Jeanie Sewer, NP

## 2022-12-30 NOTE — Assessment & Plan Note (Signed)
chronic level slightly low over a year ago given weekly RX for 3 mos, pt did switch to daily OTC, but was not consistent wants to recheck level today advised safe OTC dose is 2,000units qd, but will see if level is very low & may send RX again

## 2022-12-30 NOTE — Assessment & Plan Note (Signed)
chronic, no labs in over a year, ran out of med months ago denies feeling any sx was taking 70mg of Levothyroxine rechecking labs today will send refill after review of labs f/u 643myr

## 2022-12-31 LAB — COMPREHENSIVE METABOLIC PANEL
AG Ratio: 1.8 (calc) (ref 1.0–2.5)
ALT: 20 U/L (ref 6–29)
AST: 21 U/L (ref 10–35)
Albumin: 5.1 g/dL (ref 3.6–5.1)
Alkaline phosphatase (APISO): 81 U/L (ref 37–153)
BUN: 14 mg/dL (ref 7–25)
CO2: 17 mmol/L — ABNORMAL LOW (ref 20–32)
Calcium: 10 mg/dL (ref 8.6–10.4)
Chloride: 103 mmol/L (ref 98–110)
Creat: 0.68 mg/dL (ref 0.50–1.03)
Globulin: 2.9 g/dL (calc) (ref 1.9–3.7)
Glucose, Bld: 97 mg/dL (ref 65–99)
Potassium: 4.2 mmol/L (ref 3.5–5.3)
Sodium: 143 mmol/L (ref 135–146)
Total Bilirubin: 0.9 mg/dL (ref 0.2–1.2)
Total Protein: 8 g/dL (ref 6.1–8.1)

## 2022-12-31 LAB — LIPID PANEL
Cholesterol: 257 mg/dL — ABNORMAL HIGH (ref <200)
HDL: 56 mg/dL (ref >=50)
LDL Cholesterol (Calc): 166 mg/dL (calc) — ABNORMAL HIGH
Non-HDL Cholesterol (Calc): 201 mg/dL (calc) — ABNORMAL HIGH (ref <130)
Total CHOL/HDL Ratio: 4.6 (calc) (ref <5.0)
Triglycerides: 194 mg/dL — ABNORMAL HIGH (ref <150)

## 2022-12-31 LAB — T4, FREE: Free T4: 1.1 ng/dL (ref 0.8–1.8)

## 2022-12-31 LAB — TSH: TSH: 5.68 mIU/L — ABNORMAL HIGH (ref 0.40–4.50)

## 2022-12-31 LAB — VITAMIN D 25 HYDROXY (VIT D DEFICIENCY, FRACTURES): Vit D, 25-Hydroxy: 22 ng/mL — ABNORMAL LOW (ref 30–100)

## 2023-01-03 MED ORDER — VITAMIN D3 1.25 MG (50000 UT) PO CAPS: 50000.0000 [IU] | ORAL_CAPSULE | ORAL | 0 refills | Status: DC

## 2023-01-03 MED ORDER — LEVOTHYROXINE SODIUM 25 MCG PO TABS: 25.0000 ug | ORAL_TABLET | Freq: Every day | ORAL | 3 refills | Status: DC

## 2023-01-03 NOTE — Progress Notes (Signed)
Your glucose, electrolytes, blood count, liver & kidney function are all normal.  Your total cholesterol number, triglycerides & LDL (bad #) are high.  Only your triglyceride # is affected by not fasting. Need to reduce any fried foods, alcohol, nonnutritional snacks e.g. chips/cookies,pies, cakes and candies, fatty meat (red meat), high fat dairy foods:  including cheese, milk, ice cream.  Increase fruits/vegetables/fiber.   Continue or restart an exercise routine, shooting for 54mn 5-7days per week.  I recommend repeating fasting lipids (no food or drink except black coffee or water after midnight) in 6 months with an office visit.  Your TSH is high, but your actual T4 hormone level is in normal range, though on the lower end, so I have sent in the 229m dose for you to resume taking if you like. It is up to you! Your Vitamin D level is low again, I will send over the weekly RX for you to restart.

## 2023-01-03 NOTE — Addendum Note (Signed)
Addended byJeanie Sewer on: 01/03/2023 10:44 PM   Modules accepted: Orders

## 2023-01-09 ENCOUNTER — Encounter: Payer: Self-pay | Admitting: Family

## 2023-03-20 ENCOUNTER — Ambulatory Visit: Payer: BC Managed Care – PPO | Admitting: Family

## 2023-03-20 VITALS — BP 134/83 | HR 70 | Temp 96.0°F | Ht 65.0 in | Wt 211.5 lb

## 2023-03-20 DIAGNOSIS — E559 Vitamin D deficiency, unspecified: Secondary | ICD-10-CM

## 2023-03-20 DIAGNOSIS — E039 Hypothyroidism, unspecified: Secondary | ICD-10-CM

## 2023-03-20 DIAGNOSIS — I10 Essential (primary) hypertension: Secondary | ICD-10-CM

## 2023-03-20 MED ORDER — VITAMIN D3 1.25 MG (50000 UT) PO CAPS: 50000.0000 [IU] | ORAL_CAPSULE | ORAL | 0 refills | Status: DC

## 2023-03-20 NOTE — Progress Notes (Signed)
Patient ID: Mary Horn, female    DOB: 12/19/1965, 57 y.o.   MRN: [id number]  Chief Complaint  Patient presents with  . Hypertension    Pt states her BP at home is consistent.     HPI: Hypertension: Patient is currently maintained on the following medications for blood pressure: HCTZ 25mg  qd, reports readings at home pretty good, highest 135/80-90, always high in med clinics & her office. Failed meds include: none.  Patient reports good compliance with blood pressure medications. Patient denies chest pain, headaches, shortness of breath or swelling.  Vitamin D def:  started weekly RX and tolerating last level 22.   Hypothyroidism: Patient presents today for followup of Hypothyroidism.  Patient restarted Levothyroxine 2 mos ago, feeling fine. Normally good compliance with daily medication.  Patient denies any of the following symptoms: fatigue, cold intolerance, constipation, weight gain or inability to lose weight, muscle weakness, mental slowing, dry hair and skin. Last TSH and free T4: Lab Results  Component Value Date   FREE T4 1.1 12/30/2022   TSH 5.68 (H) 12/30/2022   TSH 4.940 (H) 09/30/2021   TSH 13.04 (H) 09/03/2018     Assessment & Plan:  Primary hypertension  Vitamin D deficiency -     Vitamin D3; Take 1 capsule (50,000 Units total) by mouth once a week.  Dispense: 12 capsule; Refill: 0  Hypothyroidism, unspecified type    Subjective:    Outpatient Medications Prior to Visit  Medication Sig Dispense Refill  . Blood Pressure KIT One kit for HTN monitoring at home as covered by insurance. ICD-10 I10 1 kit 0  . cetirizine (ZYRTEC) 10 MG tablet Take 10 mg by mouth daily.    . diazepam (VALIUM) 5 MG tablet Take 1 tablet (5 mg total) by mouth every 12 (twelve) hours as needed (severe vertigo). 20 tablet 1  . fluticasone (FLONASE) 50 MCG/ACT nasal spray Place into both nostrils daily.    . hydrochlorothiazide (HYDRODIURIL) 25 MG tablet Take 1 tablet (25 mg total)  by mouth daily. 90 tablet 1  . levothyroxine (SYNTHROID) 25 MCG tablet Take 1 tablet (25 mcg total) by mouth daily. 90 tablet 3  . meclizine (ANTIVERT) 25 MG tablet Take 1 tablet (25 mg total) by mouth 3 (three) times daily as needed for dizziness or nausea. Take at the first sign of vertigo symptoms 60 tablet 3  . Cholecalciferol (VITAMIN D3) 1.25 MG (50000 UT) capsule Take 1 capsule (50,000 Units total) by mouth once a week. When finished, start a daily OTC Vitamin D3 up to 2,000units. 12 capsule 0   No facility-administered medications prior to visit.   Past Medical History:  Diagnosis Date  . Anxiety   . Depression   . Thyroid disease    Past Surgical History:  Procedure Laterality Date  . WISDOM TOOTH EXTRACTION     No Known Allergies    Objective:    Physical Exam Vitals and nursing note reviewed.  Constitutional:      Appearance: Normal appearance.  Cardiovascular:     Rate and Rhythm: Normal rate and regular rhythm.  Pulmonary:     Effort: Pulmonary effort is normal.     Breath sounds: Normal breath sounds.  Musculoskeletal:        General: Normal range of motion.  Skin:    General: Skin is warm and dry.  Neurological:     Mental Status: She is alert.  Psychiatric:        Mood and Affect: Mood  normal.        Behavior: Behavior normal.   BP 134/83 (BP Location: Left Arm, Patient Position: Sitting, Cuff Size: Large)   Pulse 70   Temp (!) 96 F (35.6 C) (Temporal)   Ht 5\' 5"  (1.651 m)   Wt 211 lb 8 oz (95.9 kg)   SpO2 97%   BMI 35.20 kg/m  Wt Readings from Last 3 Encounters:  03/20/23 211 lb 8 oz (95.9 kg)  12/30/22 205 lb 6 oz (93.2 kg)  06/30/22 210 lb (95.3 kg)       Dulce Sellar, NP

## 2023-03-21 ENCOUNTER — Encounter: Payer: Self-pay | Admitting: Family

## 2023-03-21 NOTE — Assessment & Plan Note (Signed)
chronic restarted RX Vitamin D weekly will recheck level in another 3 mos sending another 3 mos refill

## 2023-03-21 NOTE — Assessment & Plan Note (Addendum)
Chronic, stable taking HCTZ 25mg  qd, BP better today  pt forgot to bring her home monitor, advised can schedule a nurse visit and bring back to check continue to advise pt on lifestyle modifications to help lower BP including wt loss, exercise, decreasing stress, low sodium diet, increasing water intake f/u 3 months

## 2023-03-29 ENCOUNTER — Ambulatory Visit: Payer: BC Managed Care – PPO

## 2023-05-17 ENCOUNTER — Encounter: Payer: Self-pay | Admitting: Family

## 2023-05-17 ENCOUNTER — Other Ambulatory Visit: Payer: Self-pay

## 2023-05-17 DIAGNOSIS — I10 Essential (primary) hypertension: Secondary | ICD-10-CM

## 2023-05-17 MED ORDER — HYDROCHLOROTHIAZIDE 25 MG PO TABS: 25.0000 mg | ORAL_TABLET | Freq: Every day | ORAL | 1 refills | Status: DC

## 2023-05-25 ENCOUNTER — Telehealth: Payer: BC Managed Care – PPO | Admitting: Physician Assistant

## 2023-05-25 DIAGNOSIS — I10 Essential (primary) hypertension: Secondary | ICD-10-CM | POA: Diagnosis not present

## 2023-05-25 MED ORDER — PROPRANOLOL HCL 10 MG PO TABS: 10.0000 mg | ORAL_TABLET | Freq: Three times a day (TID) | ORAL | 0 refills | Status: AC | PRN

## 2023-05-25 NOTE — Patient Instructions (Signed)
Sherwood Gambler, thank you for joining Margaretann Loveless, PA-C for today's virtual visit.  While this provider is not your primary care provider (PCP), if your PCP is located in our provider database this encounter information will be shared with them immediately following your visit.   A [facility] MyChart account gives you access to today's visit and all your visits, tests, and labs performed at [facility] " click here if you don't have a [facility] MyChart account or go to [email or web address]  Consent: (Patient) Mary Horn provided verbal consent for this virtual visit at the beginning of the encounter.  Current Medications:  Current Outpatient Medications:    propranolol (INDERAL) 10 MG tablet, Take 1 tablet (10 mg total) by mouth 3 (three) times daily as needed., Disp: 30 tablet, Rfl: 0   Blood Pressure KIT, One kit for HTN monitoring at home as covered by insurance. ICD-10 I10, Disp: 1 kit, Rfl: 0   cetirizine (ZYRTEC) 10 MG tablet, Take 10 mg by mouth daily., Disp: , Rfl:    Cholecalciferol (VITAMIN D3) 1.25 MG (50000 UT) CAPS, Take 1 capsule (50,000 Units total) by mouth once a week., Disp: 12 capsule, Rfl: 0   diazepam (VALIUM) 5 MG tablet, Take 1 tablet (5 mg total) by mouth every 12 (twelve) hours as needed (severe vertigo)., Disp: 20 tablet, Rfl: 1   fluticasone (FLONASE) 50 MCG/ACT nasal spray, Place into both nostrils daily., Disp: , Rfl:    hydrochlorothiazide (HYDRODIURIL) 25 MG tablet, Take 1 tablet (25 mg total) by mouth daily., Disp: 90 tablet, Rfl: 1   levothyroxine (SYNTHROID) 25 MCG tablet, Take 1 tablet (25 mcg total) by mouth daily., Disp: 90 tablet, Rfl: 3   meclizine (ANTIVERT) 25 MG tablet, Take 1 tablet (25 mg total) by mouth 3 (three) times daily as needed for dizziness or nausea. Take at the first sign of vertigo symptoms, Disp: 60 tablet, Rfl: 3   Medications ordered in this encounter:  Meds ordered this encounter  Medications   propranolol  (INDERAL) 10 MG tablet    Sig: Take 1 tablet (10 mg total) by mouth 3 (three) times daily as needed.    Dispense:  30 tablet    Refill:  0    Order Specific Question:   Supervising Provider    Answer:   Merrilee Jansky [id number]     *If you need refills on other medications prior to your next appointment, please contact your pharmacy*  Follow-Up: Call back or seek an in-person evaluation if the symptoms worsen or if the condition fails to improve as anticipated.  [facility] [phone]  Other Instructions Propranolol Tablets What is this medication? PROPRANOLOL (proe PRAN oh lole) treats many conditions such as high blood pressure, tremors, and a type of arrhythmia known as AFib (atrial fibrillation). It works by lowering your blood pressure and heart rate, making it easier for your heart to pump blood to the rest of your body. It may be used to prevent migraine headaches. It works by relaxing the blood vessels in the brain that cause migraines. It belongs to a group of medications called beta blockers. This medicine may be used for other purposes; ask your health care provider or pharmacist if you have questions. COMMON BRAND NAME(S): Inderal What should I tell my care team before I take this medication? They need to know if you have any of these conditions: Diabetes Having surgery Heart or blood vessel conditions, such as slow heartbeat, heart failure,  heart block Kidney disease Liver disease Lung or breathing disease, such as asthma or COPD Myasthenia gravis Pheochromocytoma Thyroid disease An unusual or allergic reaction to propranolol, other medications, foods, dyes, or preservatives Pregnant or trying to get pregnant Breastfeeding How should I use this medication? Take this medication by mouth. Take it as directed on the prescription label at the same time every day. Keep taking it unless your care team tells you to stop. Talk to your care team about the  use of this medication in children. Special care may be needed. Overdosage: If you think you have taken too much of this medicine contact a poison control center or emergency room at once. NOTE: This medicine is only for you. Do not share this medicine with others. What if I miss a dose? If you miss a dose, take it as soon as you can. If it is almost time for your next dose, take only that dose. Do not take double or extra doses. What may interact with this medication? Do not take this medication with any of the following: Thioridazine This medication may also interact with the following: Certain medications for blood pressure, heart disease, irregular heartbeat Epinephrine NSAIDs, medications for pain and inflammation, such as ibuprofen or naproxen Warfarin Other medications may affect the way this medication works. Talk with your care team about all of the medications you take. They may suggest changes to your treatment plan to lower the risk of side effects and to make sure your medications work as intended. This list may not describe all possible interactions. Give your health care provider a list of all the medicines, herbs, non-prescription drugs, or dietary supplements you use. Also tell them if you smoke, drink alcohol, or use illegal drugs. Some items may interact with your medicine. What should I watch for while using this medication? Visit your care team for regular checks on your progress. Check your blood pressure as directed. Know what your blood pressure should be and when to contact your care team. This medication may affect your coordination, reaction time, or judgment. Do not drive or operate machinery until you know how this medication affects you. Sit up or stand slowly to reduce the risk of dizzy or fainting spells. Drinking alcohol with this medication can increase the risk of these side effects. Do not suddenly stop taking this medication. This may increase your risk of side  effects, such as chest pain and heart attack. If you no longer need to take this medication, your care team will lower the dose slowly over time to decrease the risk of side effects. If you are going to need surgery or a procedure, tell your care team that you are using this medication. This medication may affect blood glucose levels. It can also mask the symptoms of low blood sugar, such as a rapid heartbeat and tremors. If you have diabetes, it is important to check your blood sugar often while you are taking this medication. Do not treat yourself for coughs, colds, or pain while you are using this medication without asking your care team for advice. Some medications may increase your blood pressure. What side effects may I notice from receiving this medication? Side effects that you should report to your care team as soon as possible: Allergic reactions--skin rash, itching, hives, swelling of the face, lips, tongue, or throat Heart failure--shortness of breath, swelling of the ankles, feet, or hands, sudden weight gain, unusual weakness or fatigue Low blood pressure--dizziness, feeling faint or lightheaded,  blurry vision Raynaud's--cool, numb, or painful fingers or toes that may change color from pale, to blue, to red Redness, blistering, peeling, or loosening of the skin, including inside the mouth Slow heartbeat--dizziness, feeling faint or lightheaded, confusion, trouble breathing, unusual weakness or fatigue Worsening mood, feelings of depression Side effects that usually do not require medical attention (report to your care team if they continue or are bothersome): Change in sex drive or performance Diarrhea Dizziness Fatigue Headache This list may not describe all possible side effects. Call your doctor for medical advice about side effects. You may report side effects to FDA at 1-800-FDA-1088. Where should I keep my medication? Keep out of the reach of children and pets. Store at room  temperature between 20 and 25 degrees C (68 and 77 degrees F). Protect from light. Throw away any unused medication after the expiration date. NOTE: This sheet is a summary. It may not cover all possible information. If you have questions about this medicine, talk to your doctor, pharmacist, or health care provider.  2024 Elsevier/Gold Standard (2022-10-31 00:00:00)    If you have been instructed to have an in-person evaluation today at a local Urgent Care facility, please use the link below. It will take you to a list of all of our available [facility], including address, phone number and hours of operation. Please do not delay care.  [facility]  If you or a family member do not have a primary care provider, use the link below to schedule a visit and establish care. When you choose a [facility] primary care physician or advanced practice provider, you gain a long-term partner in health. Find a Primary Care Provider  Learn more about [facility]'s in-office and virtual care options:  - Get Care Now

## 2023-05-25 NOTE — Progress Notes (Signed)
Virtual Visit Consent   Mary Horn, you are scheduled for a virtual visit with a [facility] provider today. Just as with appointments in the office, your consent must be obtained to participate. Your consent will be active for this visit and any virtual visit you may have with one of our providers in the next 365 days. If you have a MyChart account, a copy of this consent can be sent to you electronically.  As this is a virtual visit, video technology does not allow for your provider to perform a traditional examination. This may limit your provider's ability to fully assess your condition. If your provider identifies any concerns that need to be evaluated in person or the need to arrange testing (such as labs, EKG, etc.), we will make arrangements to do so. Although advances in technology are sophisticated, we cannot ensure that it will always work on either your end or our end. If the connection with a video visit is poor, the visit may have to be switched to a telephone visit. With either a video or telephone visit, we are not always able to ensure that we have a secure connection.  By engaging in this virtual visit, you consent to the provision of healthcare and authorize for your insurance to be billed (if applicable) for the services provided during this visit. Depending on your insurance coverage, you may receive a charge related to this service.  I need to obtain your verbal consent now. Are you willing to proceed with your visit today? Kamarri Fischetti has provided verbal consent on 05/25/2023 for a virtual visit (video or telephone). Margaretann Loveless, PA-C  Date: 05/25/2023 5:56 PM  Virtual Visit via Video Note   I, Margaretann Loveless, connected with  Mary Horn  ([id number], [id number]) on 05/25/23 at  5:30 PM EDT by a video-enabled telemedicine application and verified that I am speaking with the correct person using two identifiers.  Location: Patient: Virtual Visit Location Patient:  Home Provider: Virtual Visit Location Provider: Home Office   I discussed the limitations of evaluation and management by telemedicine and the availability of in person appointments. The patient expressed understanding and agreed to proceed.    History of Present Illness: Mary Horn is a 57 y.o. who identifies as a female who was assigned female at birth, and is being seen today for acute HTN. Previously saw PCP on 03/20/23 for HTN follow up. BP at that time in office was 134/83. Stable on HCTZ 25mg  only. Today was feeling dizzy and shaky. BP today was 160/100 with associated dizziness, then 140/90, last was 127/93. Has only recently started to feel shaky with BP lowering.  Has been diagnosed with Meniere's disease. Dizziness started on Tuesday. Wednesday felt better. Then today felt worse again. Has had decreased appetite and decreased oral intake due to Meniere's flare.  Denies SOB, chest pain, fevers, chills, diaphoresis, weakness or numbness on one side of the body, headache, or visual changes.   Problems:  Patient Active Problem List   Diagnosis Date Noted   Pre-diabetes 10/28/2021   Vitamin D deficiency 10/28/2021   Generalized anxiety disorder 09/30/2021   Primary hypertension 09/30/2021   Hypothyroidism 09/30/2021   Other allergic rhinitis 05/21/2020   Dysfunction of both eustachian tubes 05/21/2020   Obesity (BMI 30-39.9)     Allergies: No Known Allergies Medications:  Current Outpatient Medications:    propranolol (INDERAL) 10 MG tablet, Take 1 tablet (10 mg total) by mouth 3 (three) times daily as needed.,  Disp: 30 tablet, Rfl: 0   Blood Pressure KIT, One kit for HTN monitoring at home as covered by insurance. ICD-10 I10, Disp: 1 kit, Rfl: 0   cetirizine (ZYRTEC) 10 MG tablet, Take 10 mg by mouth daily., Disp: , Rfl:    Cholecalciferol (VITAMIN D3) 1.25 MG (50000 UT) CAPS, Take 1 capsule (50,000 Units total) by mouth once a week., Disp: 12 capsule, Rfl: 0   diazepam  (VALIUM) 5 MG tablet, Take 1 tablet (5 mg total) by mouth every 12 (twelve) hours as needed (severe vertigo)., Disp: 20 tablet, Rfl: 1   fluticasone (FLONASE) 50 MCG/ACT nasal spray, Place into both nostrils daily., Disp: , Rfl:    hydrochlorothiazide (HYDRODIURIL) 25 MG tablet, Take 1 tablet (25 mg total) by mouth daily., Disp: 90 tablet, Rfl: 1   levothyroxine (SYNTHROID) 25 MCG tablet, Take 1 tablet (25 mcg total) by mouth daily., Disp: 90 tablet, Rfl: 3   meclizine (ANTIVERT) 25 MG tablet, Take 1 tablet (25 mg total) by mouth 3 (three) times daily as needed for dizziness or nausea. Take at the first sign of vertigo symptoms, Disp: 60 tablet, Rfl: 3  Observations/Objective: Patient is well-developed, well-nourished in no acute distress.  Resting comfortably at home.  Head is normocephalic, atraumatic.  No labored breathing.  Speech is clear and coherent with logical content.  Patient is alert and oriented at baseline.    Assessment and Plan: 1. Primary hypertension - propranolol (INDERAL) 10 MG tablet; Take 1 tablet (10 mg total) by mouth 3 (three) times daily as needed.  Dispense: 30 tablet; Refill: 0  - Add Propranolol 10mg  PRN (can have low BP at baseline so will use PRN to not drop acutely) - Continue HCTZ - Push fluids - Follow up with PCP in 2-3 weeks - Seek immediate in person evaluation if symptoms worsen or any warning symptoms like chest pain, shortness of breath, dyspnea on exertion, worsening nausea and vomiting (not associated with Meniere flare), visual changes, severe headaches, or numbness/tingling/weakness on one side of the body only occurs  Follow Up Instructions: I discussed the assessment and treatment plan with the patient. The patient was provided an opportunity to ask questions and all were answered. The patient agreed with the plan and demonstrated an understanding of the instructions.  A copy of instructions were sent to the patient via MyChart unless otherwise  noted below.    The patient was advised to call back or seek an in-person evaluation if the symptoms worsen or if the condition fails to improve as anticipated.  Time:  I spent 15 minutes with the patient via telehealth technology discussing the above problems/concerns.    Margaretann Loveless, PA-C

## 2023-06-12 ENCOUNTER — Other Ambulatory Visit: Payer: Self-pay | Admitting: Nurse Practitioner

## 2023-06-12 DIAGNOSIS — Z1231 Encounter for screening mammogram for malignant neoplasm of breast: Secondary | ICD-10-CM

## 2023-06-15 ENCOUNTER — Ambulatory Visit: Payer: 59 | Admitting: Nurse Practitioner

## 2023-06-22 ENCOUNTER — Ambulatory Visit: Payer: BC Managed Care – PPO | Admitting: Nurse Practitioner

## 2023-06-22 ENCOUNTER — Encounter: Payer: Self-pay | Admitting: Nurse Practitioner

## 2023-06-22 VITALS — BP 144/92 | HR 72 | Wt 210.2 lb

## 2023-06-22 DIAGNOSIS — E039 Hypothyroidism, unspecified: Secondary | ICD-10-CM

## 2023-06-22 DIAGNOSIS — I10 Essential (primary) hypertension: Secondary | ICD-10-CM

## 2023-06-22 DIAGNOSIS — E669 Obesity, unspecified: Secondary | ICD-10-CM | POA: Diagnosis not present

## 2023-06-22 NOTE — Progress Notes (Signed)
Tollie Eth, DNP, AGNP-c [facility] [address] [place], [place] [address] [phone]   ACUTE VISIT- ESTABLISHED PATIENT  Blood pressure (!) 144/92, pulse 72, weight 210 lb 3.2 oz (95.3 kg), SpO2 98%.  Subjective:  HPI Mary Horn is a 57 y.o. female presents to day for evaluation of: BP:  A couple of weeks ago she had some elevated blood pressure and symptoms of vertigo with dizzy and shaky sensation. She was seen via video visit for evaluation of her symptoms. She reports they added propranolol to her hydrochlorothiazide for use as needed up to three times a day. She does have a history of meniere's disease.   She did take one dose of the propranolol, but her at home BP readings have been good since that time.   She does have questions about her cardiac risks with her recent cholesterol readings. Her LDL was elevated, but she does not necessarily want to start medication for cholesterol.    PMH, Medications, and Allergies reviewed and updated in chart as appropriate.   ROS negative except for what is listed in HPI. Objective:  Physical Exam Vitals and nursing note reviewed.  Constitutional:      Appearance: Normal appearance.  HENT:     Head: Normocephalic.  Eyes:     Conjunctiva/sclera: Conjunctivae normal.     Pupils: Pupils are equal, round, and reactive to light.  Neck:     Vascular: No carotid bruit.  Cardiovascular:     Rate and Rhythm: Normal rate and regular rhythm.     Pulses: Normal pulses.     Heart sounds: Normal heart sounds.  Pulmonary:     Effort: Pulmonary effort is normal.     Breath sounds: Normal breath sounds.  Abdominal:     General: Bowel sounds are normal.     Palpations: Abdomen is soft.  Musculoskeletal:        General: Normal range of motion.     Cervical back: Normal range of motion.     Right lower leg: No edema.     Left lower leg: No edema.  Skin:    General: Skin is warm.     Capillary Refill: Capillary  refill takes less than 2 seconds.  Neurological:     General: No focal deficit present.     Mental Status: She is alert and oriented to person, place, and time.  Psychiatric:        Mood and Affect: Mood normal.         Assessment & Plan:   Problem List Items Addressed This Visit     Obesity (BMI 30-39.9) (Chronic)    Chronic in the setting of HTN. Weight is stable at this time.  Plan: - Continue to work on diet, exercise, and increased water intake.      Relevant Orders   CT CARDIAC SCORING (SELF PAY ONLY) (Completed)   Primary hypertension - Primary    Chronic. BP elevated today, but this is the first visit to a new office. Her home readings have been improved recently. She does have the propranolol to take as needed, which I feel is appropriate. We can certainly consider increasing her hydrochlorothiazide if needed, as well. Goal <130/85 on average. We discussed fasting labs and cardiac CT score to evaluate her risks given her elevated lipids, as well.  Plan: - Continue hydrochlorothiazide daily. Add propranolol as needed for BP > 130/85. - Monitor BP at home once a day.  - Recommend daily exercise, plenty  of water, low fat/low carb diet, and high fiber to help with management.  - F/U if BP readings remain elevated or new symptoms present.       Relevant Orders   TSH (Completed)   CT CARDIAC SCORING (SELF PAY ONLY) (Completed)   Hypothyroidism    Chronic. Currently on levothyroxine daily. Having some intermittent increased BP and vertigo like symptoms, but not sure these are related to thyroid. Last labs 6+ months ago.  Plan: - Will recheck labs today.  - Will make changes to plan of care based on findings as needed          Time: 34 minutes, >50% spent counseling, care coordination, chart review, and documentation.    Tollie Eth, DNP, AGNP-c   History, Medications, Surgery, SDOH, and Family History reviewed and updated as appropriate.

## 2023-06-24 ENCOUNTER — Other Ambulatory Visit: Payer: Self-pay | Admitting: Family

## 2023-06-24 DIAGNOSIS — I10 Essential (primary) hypertension: Secondary | ICD-10-CM

## 2023-06-26 ENCOUNTER — Telehealth: Payer: Self-pay | Admitting: Nurse Practitioner

## 2023-06-26 NOTE — Telephone Encounter (Signed)
Pt called, states you wanted her to have some bloodwork done and Cardiac scoring  She wanted to know about getting that set up, did not see any orders in for bloodwork

## 2023-07-03 ENCOUNTER — Telehealth: Payer: Self-pay | Admitting: Nurse Practitioner

## 2023-07-03 NOTE — Telephone Encounter (Signed)
Pt called about labs  that are showing ordered  in Epic ( these are from 2023)  she is apparently seeing something in [place] that these need to be done. Do you still want these tests done ? She does have CPE in November

## 2023-07-05 ENCOUNTER — Ambulatory Visit (HOSPITAL_COMMUNITY)
Admission: RE | Admit: 2023-07-05 | Discharge: 2023-07-05 | Disposition: A | Discharge status: Home / Self Care | Payer: BC Managed Care – PPO | Source: Ambulatory Visit | Attending: Nurse Practitioner | Admitting: Nurse Practitioner

## 2023-07-05 ENCOUNTER — Encounter (HOSPITAL_COMMUNITY): Payer: Self-pay

## 2023-07-05 ENCOUNTER — Other Ambulatory Visit: Payer: Self-pay

## 2023-07-05 DIAGNOSIS — E669 Obesity, unspecified: Secondary | ICD-10-CM | POA: Insufficient documentation

## 2023-07-05 DIAGNOSIS — E038 Other specified hypothyroidism: Secondary | ICD-10-CM

## 2023-07-05 DIAGNOSIS — I10 Essential (primary) hypertension: Secondary | ICD-10-CM | POA: Insufficient documentation

## 2023-07-05 MED ORDER — LEVOTHYROXINE SODIUM 25 MCG PO TABS
25.0000 ug | ORAL_TABLET | ORAL | 3 refills | Status: DC
Start: 2023-07-05 — End: 2024-09-30

## 2023-07-08 NOTE — Assessment & Plan Note (Signed)
Chronic. BP elevated today, but this is the first visit to a new office. Her home readings have been improved recently. She does have the propranolol to take as needed, which I feel is appropriate. We can certainly consider increasing her hydrochlorothiazide if needed, as well. Goal <130/85 on average. We discussed fasting labs and cardiac CT score to evaluate her risks given her elevated lipids, as well.  Plan: - Continue hydrochlorothiazide daily. Add propranolol as needed for BP > 130/85. - Monitor BP at home once a day.  - Recommend daily exercise, plenty of water, low fat/low carb diet, and high fiber to help with management.  - F/U if BP readings remain elevated or new symptoms present.

## 2023-07-08 NOTE — Assessment & Plan Note (Signed)
Chronic. Currently on levothyroxine daily. Having some intermittent increased BP and vertigo like symptoms, but not sure these are related to thyroid. Last labs 6+ months ago.  Plan: - Will recheck labs today.  - Will make changes to plan of care based on findings as needed

## 2023-07-08 NOTE — Assessment & Plan Note (Signed)
Chronic in the setting of HTN. Weight is stable at this time.  Plan: - Continue to work on diet, exercise, and increased water intake.

## 2023-07-10 ENCOUNTER — Encounter: Payer: Self-pay | Admitting: Nurse Practitioner

## 2023-07-10 DIAGNOSIS — E782 Mixed hyperlipidemia: Secondary | ICD-10-CM

## 2023-07-10 DIAGNOSIS — I10 Essential (primary) hypertension: Secondary | ICD-10-CM

## 2023-07-10 DIAGNOSIS — R7303 Prediabetes: Secondary | ICD-10-CM

## 2023-07-10 DIAGNOSIS — E669 Obesity, unspecified: Secondary | ICD-10-CM

## 2023-07-19 MED ORDER — ROSUVASTATIN CALCIUM 10 MG PO TABS: ORAL_TABLET | ORAL | 3 refills | Status: DC

## 2023-07-19 NOTE — Telephone Encounter (Signed)
Can you please call the patient to set up repeat fasting labs (labs only) in 3 months? I have placed the orders for this. Thank you!

## 2023-07-20 ENCOUNTER — Ambulatory Visit
Admission: RE | Admit: 2023-07-20 | Discharge: 2023-07-20 | Disposition: A | Discharge status: Home / Self Care | Payer: BC Managed Care – PPO | Source: Ambulatory Visit | Attending: Internal Medicine | Admitting: Internal Medicine

## 2023-07-20 VITALS — BP 157/88 | HR 68 | Temp 97.8°F | Resp 18

## 2023-07-20 DIAGNOSIS — R0789 Other chest pain: Secondary | ICD-10-CM

## 2023-07-20 DIAGNOSIS — I10 Essential (primary) hypertension: Secondary | ICD-10-CM

## 2023-07-20 NOTE — ED Provider Notes (Signed)
[facility]  Note:  This document was prepared using Conservation officer, historic buildings and may include unintentional dictation errors.  MRN: [id number] DOB: 1966/04/24  Subjective:   Mary Horn is a 57 y.o. female presenting for 4-day history of intermittent mid to left-sided chest pressure.  Denies that it is an active pain and feels it is more pressure type sensation.  No fever, diaphoresis, nausea, vomiting, abdominal pain, shortness of breath, left-sided neck/jaw/arm pain.  No history of heart disease.  She does have dyslipidemia.  No smoking.  She does use occasional marijuana but no other drug use.  Has a glass of wine infrequently.  Patient has a history of hypertension but has not had to take anything consistently for this.  Currently her PCP prescribed her propranolol to take as needed.  Patient reports that generally her blood pressures are normal at home.  Reports whitecoat syndrome.  She does have significant concern because of her recent CT calcium scoring.  Was found to have a score of 2 for her left anterior descending artery.  Subsequently, her PCP prescribed her rosuvastatin as appropriate based off of the guidelines.  However, patient wants to make sure she had an evaluation for her current symptoms.  No current facility-administered medications for this encounter.  Current Outpatient Medications:    Blood Pressure KIT, One kit for HTN monitoring at home as covered by insurance. ICD-10 I10, Disp: 1 kit, Rfl: 0   cetirizine (ZYRTEC) 10 MG tablet, Take 10 mg by mouth daily., Disp: , Rfl:    Cholecalciferol (VITAMIN D3) 1.25 MG (50000 UT) CAPS, Take 1 capsule (50,000 Units total) by mouth once a week. (Patient not taking: Reported on 06/22/2023), Disp: 12 capsule, Rfl: 0   fluticasone (FLONASE) 50 MCG/ACT nasal spray, Place into both nostrils daily., Disp: , Rfl:    hydrochlorothiazide (HYDRODIURIL) 25 MG tablet, Take 1 tablet by mouth once daily, Disp: 90  tablet, Rfl: 0   levothyroxine (SYNTHROID) 25 MCG tablet, Take 1 tablet (25 mcg total) by mouth as directed. 25 mcg 5 days a week, and 50 mcg 2 days a week, Disp: 114 tablet, Rfl: 3   meclizine (ANTIVERT) 25 MG tablet, Take 1 tablet (25 mg total) by mouth 3 (three) times daily as needed for dizziness or nausea. Take at the first sign of vertigo symptoms (Patient not taking: Reported on 06/22/2023), Disp: 60 tablet, Rfl: 3   propranolol (INDERAL) 10 MG tablet, Take 1 tablet (10 mg total) by mouth 3 (three) times daily as needed., Disp: 30 tablet, Rfl: 0   rosuvastatin (CRESTOR) 10 MG tablet, Take 1 tablet (10mg ) by mouth at bedtime three days a week., Disp: 40 tablet, Rfl: 3   No Known Allergies  Past Medical History:  Diagnosis Date   Anxiety    Depression    Encounter to establish care 09/30/2021   Thyroid disease      Past Surgical History:  Procedure Laterality Date   WISDOM TOOTH EXTRACTION      Family History  Problem Relation Age of Onset   Breast cancer Mother    Pancreatic cancer Mother    Heart disease Father    Anxiety disorder Father    Anxiety disorder Sister    Asthma Sister    Depression Sister    Breast cancer Maternal Aunt     Social History   Tobacco Use   Smoking status: Never   Smokeless tobacco: Never  Vaping Use   Vaping status: Never  Used  Substance Use Topics   Alcohol use: Yes    Alcohol/week: 1.0 standard drink of alcohol    Types: 1 Glasses of wine per week    Comment: Consume alcohol very infrequently but do occasionally   Drug use: Yes    Types: Marijuana    Comment: Occasionally    ROS   Objective:   Vitals: BP (!) 157/88 (BP Location: Right Arm)   Pulse 68   Temp 97.8 F (36.6 C) (Oral)   Resp 18   SpO2 96%   BP Readings from Last 3 Encounters:  07/20/23 (!) 157/88  06/22/23 (!) 144/92  03/20/23 134/83   Physical Exam Constitutional:      General: She is not in acute distress.    Appearance: Normal appearance. She is  well-developed. She is not ill-appearing, toxic-appearing or diaphoretic.  HENT:     Head: Normocephalic and atraumatic.     Nose: Nose normal.     Mouth/Throat:     Mouth: Mucous membranes are moist.  Eyes:     General: No scleral icterus.       Right eye: No discharge.        Left eye: No discharge.     Extraocular Movements: Extraocular movements intact.  Cardiovascular:     Rate and Rhythm: Normal rate and regular rhythm.     Heart sounds: Normal heart sounds. No murmur heard.    No friction rub. No gallop.  Pulmonary:     Effort: Pulmonary effort is normal. No respiratory distress.     Breath sounds: No stridor. No wheezing, rhonchi or rales.  Chest:     Chest wall: No tenderness.  Skin:    General: Skin is warm and dry.  Neurological:     General: No focal deficit present.     Mental Status: She is alert and oriented to person, place, and time.  Psychiatric:        Mood and Affect: Mood normal.        Behavior: Behavior normal.    ED ECG REPORT   Date: 07/20/2023  EKG Time: 10:14 AM  Rate: 67bpm  Rhythm: normal sinus rhythm  Axis: normal  Intervals:none  ST&T Change: Nonspecific T wave flattening in lead aVL  Narrative Interpretation: Sinus rhythm at 67 bpm with poor R wave progression, nonspecific T wave change in 1-lead as above.  Very comparable to previous EKG apart from the T wave flattening.   Assessment and Plan :   PDMP not reviewed this encounter.  1. Atypical chest pain   2. Sensation of chest pressure   3. Primary hypertension    Patient has significant concern about her cardiac health.  Discussed the options which include more testing through the emergency room.  Ultimately we both agreed that we would pursue an urgent referral to cardiology group.  I placed this referral now.  She will maintain strict ER precautions.  Will monitor her blood pressure at home and recheck with her PCP about taking propranolol versus other hypertensive medication.  Also  recommended that she start 81 mg baby aspirin, can also revisit this with her PCP.  Counseled patient on potential for adverse effects with medications prescribed/recommended today, ER and return-to-clinic precautions discussed, patient verbalized understanding.    Wallis Bamberg, PA-C 07/20/23 1320

## 2023-07-20 NOTE — ED Triage Notes (Signed)
Pt presents to UC w/ c/o left sided chest pain/pressure x4 days. Denies shortness of breath.

## 2023-07-20 NOTE — Discharge Instructions (Addendum)
Please make sure you follow up with your PCP about your blood pressure.  Take those readings at home and record them.  The general range for normal blood pressure is 110-130 systolic (top number) over 70-90 diastolic(bottom number).  Continue to take all the medications prescribed to you.  You can start a baby aspirin of 81 mg once daily or revisit this with your PCP.  I did place a referral to cardiology for consultation regarding your chest pressure/chest pain.  Presenting to the emergency room for urgent testing and is also a very reasonable option especially if your pressure becomes more consistent pain.  If you cannot get to the emergency room safely or if your symptoms are severe, call 911.

## 2023-07-25 NOTE — Progress Notes (Unsigned)
Cardiology Office Note:   Date:  07/26/2023  NAME:  Mary Horn    MRN: [id number] DOB:  Apr 30, 1966   PCP:  Tollie Eth, NP  Cardiologist:  None  Electrophysiologist:  None   Referring MD: Tollie Eth, NP   Chief Complaint  Patient presents with   Chest Pain    History of Present Illness:   Mary Horn is a 57 y.o. female with a hx of HTN who is being seen today for the evaluation of chest pain at the request of [place], Sung Amabile, NP. Seen in urgent care 07/20/2023 for CP. Referred to cardiology.  She reports episodes of chest pressure for the past 1 to 2 weeks.  Symptoms can last 3 to 4 hours.  Described as pressure.  Not alleviated by cessation of activity.  Not worsened by activity.  Symptoms appear to be constant.  They are occurring 2-3 times per week.  She was seen in the emergency room with negative evaluation.  Blood pressure is stable.  She reports no prior history of heart disease.  No strong family history reported.  She does report having an elevated coronary calcium score.  Reviewed below.  She reports no shortness of breath.  She was noted to have a hiatal hernia on CT imaging.  CV exam normal.  She does not smoke.  No alcohol or drug use.  She is single.  Reports no children.  Works for a [employer] (medical).  She does have hypertension that is well treated.  She denies any regular exercise.  She does describe nausea in the morning.  We did discuss symptoms could be acid reflux related.  Problem List Coronary calcium  -CAC 2 (72nd percentile) 2. HLD -T chol 257, HDL 56, LDL 166, TG 194   3. Hypertension  Past Medical History: Past Medical History:  Diagnosis Date   Anxiety    Depression    Encounter to establish care 09/30/2021   Hypertension    Thyroid disease     Past Surgical History: Past Surgical History:  Procedure Laterality Date   WISDOM TOOTH EXTRACTION      Current Medications: Current Meds  Medication Sig   Blood Pressure KIT One kit for HTN  monitoring at home as covered by insurance. ICD-10 I10   cetirizine (ZYRTEC) 10 MG tablet Take 10 mg by mouth daily.   Cholecalciferol (VITAMIN D3) 1.25 MG (50000 UT) CAPS Take 1 capsule (50,000 Units total) by mouth once a week.   fluticasone (FLONASE) 50 MCG/ACT nasal spray Place into both nostrils daily.   hydrochlorothiazide (HYDRODIURIL) 25 MG tablet Take 1 tablet by mouth once daily   levothyroxine (SYNTHROID) 25 MCG tablet Take 1 tablet (25 mcg total) by mouth as directed. 25 mcg 5 days a week, and 50 mcg 2 days a week   meclizine (ANTIVERT) 25 MG tablet Take 1 tablet (25 mg total) by mouth 3 (three) times daily as needed for dizziness or nausea. Take at the first sign of vertigo symptoms   metoprolol tartrate (LOPRESSOR) 100 MG tablet Take 1 tablet (100 mg total) by mouth once for 1 dose. Take 90-120 minutes prior to scan. Hold for SBP less than 110.   rosuvastatin (CRESTOR) 10 MG tablet Take 1 tablet (10mg ) by mouth at bedtime three days a week.     Allergies:    Patient has no known allergies.   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  Tobacco Use   Smoking status: Never   Smokeless tobacco: Never  Vaping Use   Vaping status: Never Used  Substance and Sexual Activity   Alcohol use: Yes    Alcohol/week: 1.0 standard drink of alcohol    Types: 1 Glasses of wine per week    Comment: Consume alcohol very infrequently but do occasionally   Drug use: Yes    Comment: Occasionally   Sexual activity: Not Currently  Other Topics Concern   Not on file  Social History Narrative   Not on file   Social Determinants of Health   Financial Resource Strain: Not on file  Food Insecurity: Not on file  Transportation Needs: Not on file  Physical Activity: Not on file   Stress: Not on file  Social Connections: Not on file     Family History: The patient's family history includes Anxiety disorder in her father and sister; Asthma in her sister; Breast cancer in her maternal aunt and mother; Depression in her sister; Heart disease in her father; Pancreatic cancer in her mother.  ROS:   All other ROS reviewed and negative. Pertinent positives noted in the HPI.     EKGs/Labs/Other Studies Reviewed:   The following studies were personally reviewed by me today:  EKG:  EKG is not ordered today.  EKG reviewed from 07/20/2023.  That EKG shows normal sinus rhythm heart rate 64 with no acute ischemic changes or evidence of infarction.      Recent Labs: 12/30/2022: ALT 20; BUN 14; Creat 0.68; Potassium 4.2; Sodium 143 06/22/2023: TSH 4.110   Recent Lipid Panel    Component Value Date/Time   CHOL 257 (H) 12/30/2022 1608   CHOL 217 (H) 09/30/2021 1550   TRIG 194 (H) 12/30/2022 1608   HDL 56 12/30/2022 1608   HDL 51 09/30/2021 1550   CHOLHDL 4.6 12/30/2022 1608   VLDL 32 (H) 07/07/2015 1044   LDLCALC 166 (H) 12/30/2022 1608    Physical Exam:   VS:  BP 122/80   Pulse 74   Ht 5\' 5"  (1.651 m)   Wt 208 lb 9.6 oz (94.6 kg)   SpO2 98%   BMI 34.71 kg/m    Wt Readings from Last 3 Encounters:  07/26/23 208 lb 9.6 oz (94.6 kg)  06/22/23 210 lb 3.2 oz (95.3 kg)  03/20/23 211 lb 8 oz (95.9 kg)    General: Well nourished, well developed, in no acute distress Head: Atraumatic, normal size  Eyes: PEERLA, EOMI  Neck: Supple, no JVD Endocrine: No thryomegaly Cardiac: Normal S1, S2; RRR; no murmurs, rubs, or gallops Lungs: Clear to auscultation bilaterally, no wheezing, rhonchi or rales  Abd: Soft, nontender, no hepatomegaly  Ext: No edema, pulses 2+ Musculoskeletal: No deformities, BUE and BLE strength normal and equal Skin: Warm and dry, no rashes   Neuro: Alert and oriented to person, place, time, and situation, CNII-XII grossly intact, no focal deficits   Psych: Normal mood and affect   ASSESSMENT:   Mary Horn is a 58 y.o. female who presents for the following: 1. Precordial pain   2. Agatston coronary artery calcium score less than 100   3. Mixed hyperlipidemia     PLAN:   1. Precordial pain 2. Agatston coronary artery calcium score less than 100 3.  Mixed hyperlipidemia -Describes 1 to 2 weeks of chest pressure.  EKG nonischemic.  Symptoms do not sound cardiac.  Suspect this could be acid reflux related.  She does have a coronary calcium score of 2.  I have recommended to proceed with coronary CTA for further evaluation.  She will need a BMP.  100 mg metoprolol tartrate charge before the scan.  We did discuss that her symptoms could be acid reflux related.  She may want to try an over-the-counter acid reducer.  She will see me back as needed based on results of the scan.  We will further titrate lipid-lowering agents based on the results of coronary CTA.      Disposition: Return in about 1 year (around 07/25/2024), or if symptoms worsen or fail to improve.  Medication Adjustments/Labs and Tests Ordered: Current medicines are reviewed at length with the patient today.  Concerns regarding medicines are outlined above.  Orders Placed This Encounter  Procedures   CT CORONARY MORPH W/CTA COR W/SCORE W/CA W/CM &/OR WO/CM   Basic metabolic panel   EKG 12-Lead   Meds ordered this encounter  Medications   metoprolol tartrate (LOPRESSOR) 100 MG tablet    Sig: Take 1 tablet (100 mg total) by mouth once for 1 dose. Take 90-120 minutes prior to scan. Hold for SBP less than 110.    Dispense:  1 tablet    Refill:  0   Patient Instructions  Medication Instructions:  NO CHANGES *If you need a refill on your cardiac medications before your next appointment, please call your pharmacy*   Lab Work: BMET AT PCP END OF WEEK If you have labs (blood work) drawn today and your tests are completely normal, you will receive your results only  by: MyChart Message (if you have MyChart) OR A paper copy in the mail If you have any lab test that is abnormal or we need to change your treatment, we will call you to review the results.   Testing/Procedures:   Your cardiac CT will be scheduled at one of the below locations:   [facility] [address] [place], [place] [address] [phone]    If scheduled at [facility], please arrive at the [facility] and Children's Entrance (Entrance C2) of [facility] 30 minutes prior to test start time. You can use the FREE valet parking offered at entrance C (encouraged to control the heart rate for the test)  Proceed to the [facility] Radiology Department (first floor) to check-in and test prep.  All radiology patients and guests should use entrance C2 at [facility], accessed from [facility], even though the hospital's physical address listed is [address]      Please follow these instructions carefully (unless otherwise directed):  An IV will be required for this test and Nitroglycerin will be given.  On the Night Before the Test: Be sure to Drink plenty of water. Do not consume any caffeinated/decaffeinated beverages or chocolate 12 hours prior to your test. Do not take any antihistamines 12 hours prior to your test. On the Day of the Test: Drink plenty of water until 1 hour prior to the test. Do not eat any food 1 hour prior to test. You may take your regular medications prior to the test.  Take metoprolol (Lopressor) two hours prior to test. If you take Furosemide/Hydrochlorothiazide/Spironolactone, please HOLD on the morning of the test. FEMALES- please wear underwire-free bra if available, avoid dresses &  tight clothing  *For Clinical Staff only. Please instruct patient the following:* Heart Rate Medication Recommendations for Cardiac CT  HR > 65 bpm and BP >110/50 mmHG  Metoprolol tartrate 100 mg PO 90-120  minutes prior to scan  After the Test: Drink plenty of water. After receiving IV contrast, you may experience a mild flushed feeling. This is normal. On occasion, you may experience a mild rash up to 24 hours after the test. This is not dangerous. If this occurs, you can take Benadryl 25 mg and increase your fluid intake. If you experience trouble breathing, this can be serious. If it is severe call 911 IMMEDIATELY. If it is mild, please call our office. If you take any of these medications: Glipizide/Metformin, Avandament, Glucavance, please do not take 48 hours after completing test unless otherwise instructed.  We will call to schedule your test 2-4 weeks out understanding that some insurance companies will need an authorization prior to the service being performed.   For more information and frequently asked questions, please visit our website : [email or web address]  For non-scheduling related questions, please contact the cardiac imaging nurse navigator should you have any questions/concerns: Cardiac Imaging Nurse Navigators Direct Office Dial: [phone]   For scheduling needs, including cancellations and rescheduling, please call [place], [phone].    Follow-Up: At [facility], you and your health needs are our priority.  As part of our continuing mission to provide you with exceptional heart care, we have created designated Provider Care Teams.  These Care Teams include your primary Cardiologist (physician) and Advanced Practice Providers (APPs -  Physician Assistants and Nurse Practitioners) who all work together to provide you with the care you need, when you need it.  We recommend signing up for the patient portal called "MyChart".  Sign up information is provided on this After Visit Summary.  MyChart is used to connect with patients for Virtual Visits (Telemedicine).  Patients are able to view lab/test results, encounter notes, upcoming appointments,  etc.  Non-urgent messages can be sent to your provider as well.   To learn more about what you can do with MyChart, go to [email or web address].    Your next appointment:   AS NEEDED   Provider:    DR Ascencion Dike. Flora Lipps, MD, [facility]  [facility]  [address] [place], [place] [address] [phone]  07/26/2023 5:36 PM

## 2023-07-26 ENCOUNTER — Encounter: Payer: Self-pay | Admitting: Cardiovascular Disease

## 2023-07-26 ENCOUNTER — Ambulatory Visit: Payer: BC Managed Care – PPO | Attending: Cardiovascular Disease | Admitting: Cardiovascular Disease

## 2023-07-26 VITALS — BP 122/80 | HR 74 | Ht 65.0 in | Wt 208.6 lb

## 2023-07-26 DIAGNOSIS — R072 Precordial pain: Secondary | ICD-10-CM

## 2023-07-26 DIAGNOSIS — E782 Mixed hyperlipidemia: Secondary | ICD-10-CM

## 2023-07-26 DIAGNOSIS — R931 Abnormal findings on diagnostic imaging of heart and coronary circulation: Secondary | ICD-10-CM

## 2023-07-26 MED ORDER — METOPROLOL TARTRATE 100 MG PO TABS: 100.0000 mg | ORAL_TABLET | Freq: Once | ORAL | 0 refills | Status: DC

## 2023-07-26 NOTE — Patient Instructions (Signed)
Medication Instructions:  NO CHANGES *If you need a refill on your cardiac medications before your next appointment, please call your pharmacy*   Lab Work: BMET AT PCP END OF WEEK If you have labs (blood work) drawn today and your tests are completely normal, you will receive your results only by: MyChart Message (if you have MyChart) OR A paper copy in the mail If you have any lab test that is abnormal or we need to change your treatment, we will call you to review the results.   Testing/Procedures:   Your cardiac CT will be scheduled at one of the below locations:   [facility] [address] [place], [place] [address] [phone]    If scheduled at [facility], please arrive at the [facility] and Children's Entrance (Entrance C2) of [facility] 30 minutes prior to test start time. You can use the FREE valet parking offered at entrance C (encouraged to control the heart rate for the test)  Proceed to the [facility] Radiology Department (first floor) to check-in and test prep.  All radiology patients and guests should use entrance C2 at [facility], accessed from [facility], even though the hospital's physical address listed is [address]      Please follow these instructions carefully (unless otherwise directed):  An IV will be required for this test and Nitroglycerin will be given.  On the Night Before the Test: Be sure to Drink plenty of water. Do not consume any caffeinated/decaffeinated beverages or chocolate 12 hours prior to your test. Do not take any antihistamines 12 hours prior to your test. On the Day of the Test: Drink plenty of water until 1 hour prior to the test. Do not eat any food 1 hour prior to test. You may take your regular medications prior to the test.  Take metoprolol (Lopressor) two hours prior to test. If you take Furosemide/Hydrochlorothiazide/Spironolactone, please HOLD on the  morning of the test. FEMALES- please wear underwire-free bra if available, avoid dresses & tight clothing  *For Clinical Staff only. Please instruct patient the following:* Heart Rate Medication Recommendations for Cardiac CT  HR > 65 bpm and BP >110/50 mmHG  Metoprolol tartrate 100 mg PO 90-120 minutes prior to scan  After the Test: Drink plenty of water. After receiving IV contrast, you may experience a mild flushed feeling. This is normal. On occasion, you may experience a mild rash up to 24 hours after the test. This is not dangerous. If this occurs, you can take Benadryl 25 mg and increase your fluid intake. If you experience trouble breathing, this can be serious. If it is severe call 911 IMMEDIATELY. If it is mild, please call our office. If you take any of these medications: Glipizide/Metformin, Avandament, Glucavance, please do not take 48 hours after completing test unless otherwise instructed.  We will call to schedule your test 2-4 weeks out understanding that some insurance companies will need an authorization prior to the service being performed.   For more information and frequently asked questions, please visit our website : [email or web address]  For non-scheduling related questions, please contact the cardiac imaging nurse navigator should you have any questions/concerns: Cardiac Imaging Nurse Navigators Direct Office Dial: [phone]   For scheduling needs, including cancellations and rescheduling, please call [place], [phone].    Follow-Up: At [facility], you and your health needs are our priority.  As part of our continuing mission to provide you with exceptional heart  care, we have created designated Provider Care Teams.  These Care Teams include your primary Cardiologist (physician) and Advanced Practice Providers (APPs -  Physician Assistants and Nurse Practitioners) who all work together to provide you with the care you need, when you  need it.  We recommend signing up for the patient portal called "MyChart".  Sign up information is provided on this After Visit Summary.  MyChart is used to connect with patients for Virtual Visits (Telemedicine).  Patients are able to view lab/test results, encounter notes, upcoming appointments, etc.  Non-urgent messages can be sent to your provider as well.   To learn more about what you can do with MyChart, go to [email or web address].    Your next appointment:   AS NEEDED   Provider:    DR Scharlene Gloss

## 2023-07-28 ENCOUNTER — Other Ambulatory Visit: Payer: BC Managed Care – PPO

## 2023-07-28 DIAGNOSIS — E669 Obesity, unspecified: Secondary | ICD-10-CM

## 2023-07-28 DIAGNOSIS — R7303 Prediabetes: Secondary | ICD-10-CM

## 2023-07-28 DIAGNOSIS — E038 Other specified hypothyroidism: Secondary | ICD-10-CM | POA: Diagnosis not present

## 2023-07-28 DIAGNOSIS — E782 Mixed hyperlipidemia: Secondary | ICD-10-CM

## 2023-07-28 DIAGNOSIS — I10 Essential (primary) hypertension: Secondary | ICD-10-CM

## 2023-07-29 LAB — COMPREHENSIVE METABOLIC PANEL
ALT: 23 IU/L (ref 0–32)
AST: 21 IU/L (ref 0–40)
Albumin: 4.9 g/dL (ref 3.8–4.9)
Alkaline Phosphatase: 94 IU/L (ref 44–121)
BUN/Creatinine Ratio: 19 (ref 9–23)
BUN: 16 mg/dL (ref 6–24)
Bilirubin Total: 0.7 mg/dL (ref 0.0–1.2)
CO2: 24 mmol/L (ref 20–29)
Calcium: 10.3 mg/dL — ABNORMAL HIGH (ref 8.7–10.2)
Chloride: 100 mmol/L (ref 96–106)
Creatinine, Ser: 0.84 mg/dL (ref 0.57–1.00)
Globulin, Total: 2.5 g/dL (ref 1.5–4.5)
Glucose: 107 mg/dL — ABNORMAL HIGH (ref 70–99)
Potassium: 4 mmol/L (ref 3.5–5.2)
Sodium: 141 mmol/L (ref 134–144)
Total Protein: 7.4 g/dL (ref 6.0–8.5)
eGFR: 81 mL/min/{1.73_m2} (ref >59)

## 2023-07-29 LAB — CBC WITH DIFFERENTIAL/PLATELET
Basophils Absolute: 0.1 10*3/uL (ref 0.0–0.2)
Basos: 1 %
EOS (ABSOLUTE): 0.2 10*3/uL (ref 0.0–0.4)
Eos: 2 %
Hematocrit: 46.6 % (ref 34.0–46.6)
Hemoglobin: 15.5 g/dL (ref 11.1–15.9)
Immature Grans (Abs): 0 10*3/uL (ref 0.0–0.1)
Immature Granulocytes: 0 %
Lymphocytes Absolute: 2.1 10*3/uL (ref 0.7–3.1)
Lymphs: 30 %
MCH: 30.2 pg (ref 26.6–33.0)
MCHC: 33.3 g/dL (ref 31.5–35.7)
MCV: 91 fL (ref 79–97)
Monocytes Absolute: 0.5 10*3/uL (ref 0.1–0.9)
Monocytes: 7 %
Neutrophils Absolute: 4.2 10*3/uL (ref 1.4–7.0)
Neutrophils: 60 %
Platelets: 348 10*3/uL (ref 150–450)
RBC: 5.14 x10E6/uL (ref 3.77–5.28)
RDW: 13.1 % (ref 11.7–15.4)
WBC: 7 10*3/uL (ref 3.4–10.8)

## 2023-07-29 LAB — LIPID PANEL
Chol/HDL Ratio: 3.7 ratio (ref 0.0–4.4)
Cholesterol, Total: 171 mg/dL (ref 100–199)
HDL: 46 mg/dL (ref >39)
LDL Chol Calc (NIH): 101 mg/dL — ABNORMAL HIGH (ref 0–99)
Triglycerides: 134 mg/dL (ref 0–149)
VLDL Cholesterol Cal: 24 mg/dL (ref 5–40)

## 2023-07-29 LAB — TSH: TSH: 3.63 u[IU]/mL (ref 0.450–4.500)

## 2023-07-29 LAB — HEMOGLOBIN A1C
Est. average glucose Bld gHb Est-mCnc: 126 mg/dL
Hgb A1c MFr Bld: 6 % — ABNORMAL HIGH (ref 4.8–5.6)

## 2023-08-01 ENCOUNTER — Encounter (HOSPITAL_COMMUNITY): Payer: Self-pay

## 2023-08-03 ENCOUNTER — Ambulatory Visit (HOSPITAL_COMMUNITY)
Admission: RE | Admit: 2023-08-03 | Discharge: 2023-08-03 | Disposition: A | Discharge status: Home / Self Care | Payer: BC Managed Care – PPO | Source: Ambulatory Visit | Attending: Cardiovascular Disease | Admitting: Cardiovascular Disease

## 2023-08-03 DIAGNOSIS — I2510 Atherosclerotic heart disease of native coronary artery without angina pectoris: Secondary | ICD-10-CM | POA: Diagnosis not present

## 2023-08-03 DIAGNOSIS — R072 Precordial pain: Secondary | ICD-10-CM | POA: Diagnosis not present

## 2023-08-03 MED ORDER — IOHEXOL 350 MG/ML SOLN
95.0000 mL | Freq: Once | INTRAVENOUS | Status: AC | PRN
  Administered 2023-08-03: 95 mL via INTRAVENOUS

## 2023-08-03 MED ORDER — NITROGLYCERIN 0.4 MG SL SUBL
0.8000 mg | SUBLINGUAL_TABLET | Freq: Once | SUBLINGUAL | Status: AC
  Administered 2023-08-03: 0.8 mg via SUBLINGUAL

## 2023-08-03 MED ORDER — NITROGLYCERIN 0.4 MG SL SUBL
SUBLINGUAL_TABLET | SUBLINGUAL | Status: AC
  Filled 2023-08-03: qty 2

## 2023-08-04 ENCOUNTER — Ambulatory Visit
Admission: RE | Admit: 2023-08-04 | Discharge: 2023-08-04 | Disposition: A | Discharge status: Home / Self Care | Payer: BC Managed Care – PPO | Source: Ambulatory Visit | Attending: Nurse Practitioner

## 2023-08-04 DIAGNOSIS — Z1231 Encounter for screening mammogram for malignant neoplasm of breast: Secondary | ICD-10-CM

## 2023-08-08 DIAGNOSIS — H53143 Visual discomfort, bilateral: Secondary | ICD-10-CM | POA: Diagnosis not present

## 2023-08-22 ENCOUNTER — Telehealth: Payer: Self-pay | Admitting: Cardiovascular Disease

## 2023-08-22 ENCOUNTER — Other Ambulatory Visit: Payer: Self-pay | Admitting: Cardiovascular Disease

## 2023-08-22 DIAGNOSIS — D1803 Hemangioma of intra-abdominal structures: Secondary | ICD-10-CM

## 2023-08-22 NOTE — Telephone Encounter (Signed)
Elnita [facility] - [place] radiology calling to give CT report

## 2023-08-22 NOTE — Telephone Encounter (Signed)
Mary Horn with radiology called for finding CT heart 8 mm lesion Right lobe of liver may represent some  hemangioma. Recommend abdominal US

## 2023-08-22 NOTE — Progress Notes (Signed)
Liver ultrasound ordered.   Gerri Spore T. Flora Lipps, MD, [facility]  [facility]  [address] [place], [place] [address] [phone]  6:09 PM

## 2023-08-24 NOTE — Telephone Encounter (Signed)
Ordered has been placed on 08/22/23 by Dr Flora Lipps - limited  RUQ  --U/S ( liver/gallbladder)

## 2023-08-30 ENCOUNTER — Other Ambulatory Visit: Payer: Self-pay

## 2023-08-30 ENCOUNTER — Other Ambulatory Visit: Payer: BC Managed Care – PPO

## 2023-08-30 DIAGNOSIS — E038 Other specified hypothyroidism: Secondary | ICD-10-CM | POA: Diagnosis not present

## 2023-08-31 LAB — TSH: TSH: 2.83 u[IU]/mL (ref 0.450–4.500)

## 2023-08-31 LAB — T4, FREE: Free T4: 1.36 ng/dL (ref 0.82–1.77)

## 2023-09-01 ENCOUNTER — Ambulatory Visit (HOSPITAL_BASED_OUTPATIENT_CLINIC_OR_DEPARTMENT_OTHER)
Admission: RE | Admit: 2023-09-01 | Discharge: 2023-09-01 | Disposition: A | Discharge status: Home / Self Care | Payer: BC Managed Care – PPO | Source: Ambulatory Visit | Attending: Cardiovascular Disease

## 2023-09-01 DIAGNOSIS — R932 Abnormal findings on diagnostic imaging of liver and biliary tract: Secondary | ICD-10-CM | POA: Diagnosis not present

## 2023-09-01 DIAGNOSIS — K760 Fatty (change of) liver, not elsewhere classified: Secondary | ICD-10-CM | POA: Diagnosis not present

## 2023-09-01 DIAGNOSIS — D1803 Hemangioma of intra-abdominal structures: Secondary | ICD-10-CM | POA: Insufficient documentation

## 2023-09-01 DIAGNOSIS — K8020 Calculus of gallbladder without cholecystitis without obstruction: Secondary | ICD-10-CM | POA: Diagnosis not present

## 2023-09-06 ENCOUNTER — Other Ambulatory Visit: Payer: Self-pay | Admitting: Nurse Practitioner

## 2023-09-06 ENCOUNTER — Other Ambulatory Visit: Payer: Self-pay | Admitting: Cardiovascular Disease

## 2023-09-06 DIAGNOSIS — R160 Hepatomegaly, not elsewhere classified: Secondary | ICD-10-CM

## 2023-09-06 NOTE — Progress Notes (Signed)
Abdominal MRI ordered for liver mass.   Gerri Spore T. Flora Lipps, MD, [facility]  [facility]  [address], [address] [place], [place] [address] [phone]  4:04 PM

## 2023-09-18 ENCOUNTER — Telehealth: Payer: Self-pay | Admitting: Cardiovascular Disease

## 2023-09-18 NOTE — Telephone Encounter (Signed)
Patient is calling because she has been out of town lately and would like to clarify if there is any test or MRI that we needed her to have. Please advise.

## 2023-09-18 NOTE — Telephone Encounter (Signed)
Spoke to patient and advised her she will get a call to schedule MRI.

## 2023-09-20 ENCOUNTER — Ambulatory Visit: Payer: BC Managed Care – PPO | Admitting: Family

## 2023-09-22 ENCOUNTER — Ambulatory Visit (INDEPENDENT_AMBULATORY_CARE_PROVIDER_SITE_OTHER): Payer: BC Managed Care – PPO | Admitting: Nurse Practitioner

## 2023-09-22 ENCOUNTER — Encounter: Payer: Self-pay | Admitting: Nurse Practitioner

## 2023-09-22 VITALS — BP 128/80 | HR 78 | Ht 65.5 in | Wt 208.0 lb

## 2023-09-22 DIAGNOSIS — I10 Essential (primary) hypertension: Secondary | ICD-10-CM | POA: Diagnosis not present

## 2023-09-22 DIAGNOSIS — E559 Vitamin D deficiency, unspecified: Secondary | ICD-10-CM | POA: Diagnosis not present

## 2023-09-22 DIAGNOSIS — E669 Obesity, unspecified: Secondary | ICD-10-CM | POA: Diagnosis not present

## 2023-09-22 DIAGNOSIS — Z0000 Encounter for general adult medical examination without abnormal findings: Secondary | ICD-10-CM

## 2023-09-22 DIAGNOSIS — E039 Hypothyroidism, unspecified: Secondary | ICD-10-CM | POA: Diagnosis not present

## 2023-09-22 DIAGNOSIS — K449 Diaphragmatic hernia without obstruction or gangrene: Secondary | ICD-10-CM

## 2023-09-22 DIAGNOSIS — Z23 Encounter for immunization: Secondary | ICD-10-CM | POA: Diagnosis not present

## 2023-09-22 DIAGNOSIS — F411 Generalized anxiety disorder: Secondary | ICD-10-CM

## 2023-09-22 DIAGNOSIS — E782 Mixed hyperlipidemia: Secondary | ICD-10-CM

## 2023-09-22 DIAGNOSIS — H8103 Meniere's disease, bilateral: Secondary | ICD-10-CM | POA: Diagnosis not present

## 2023-09-22 DIAGNOSIS — K769 Liver disease, unspecified: Secondary | ICD-10-CM

## 2023-09-22 DIAGNOSIS — K7689 Other specified diseases of liver: Secondary | ICD-10-CM

## 2023-09-22 DIAGNOSIS — R7303 Prediabetes: Secondary | ICD-10-CM

## 2023-09-22 MED ORDER — VITAMIN D3 1.25 MG (50000 UT) PO CAPS: 50000.0000 [IU] | ORAL_CAPSULE | ORAL | 3 refills | Status: AC

## 2023-09-22 MED ORDER — DIAZEPAM 5 MG PO TABS
5.0000 mg | ORAL_TABLET | Freq: Two times a day (BID) | ORAL | 0 refills | Status: AC | PRN
Start: 2023-09-22 — End: ?

## 2023-09-22 MED ORDER — HYDROCHLOROTHIAZIDE 25 MG PO TABS
25.0000 mg | ORAL_TABLET | Freq: Every day | ORAL | 3 refills | Status: DC
Start: 2023-09-22 — End: 2024-09-30

## 2023-09-22 MED ORDER — MECLIZINE HCL 25 MG PO TABS: 25.0000 mg | ORAL_TABLET | Freq: Three times a day (TID) | ORAL | 6 refills | Status: AC | PRN

## 2023-09-22 NOTE — Patient Instructions (Signed)
 For all adult patients, I recommend A well balanced diet low in saturated fats, cholesterol, and moderation in carbohydrates.   This can be as simple as monitoring portion sizes and cutting back on sugary beverages such as soda and juice to start with.    Daily water consumption of at least 64 ounces.  Physical activity at least 180 minutes per week, if just starting out.   This can be as simple as taking the stairs instead of the elevator and walking 2-3 laps around the office  purposefully every day.   STD protection, partner selection, and regular testing if high risk.  Limited consumption of alcoholic beverages if alcohol is consumed.  For women, I recommend no more than 7 alcoholic beverages per week, spread out throughout the week.  Avoid "binge" drinking or consuming large quantities of alcohol in one setting.   Please let me know if you feel you may need help with reduction or quitting alcohol consumption.   Avoidance of nicotine, if used.  Please let me know if you feel you may need help with reduction or quitting nicotine use.   Daily mental health attention.  This can be in the form of 5 minute daily meditation, prayer, journaling, yoga, reflection, etc.   Purposeful attention to your emotions and mental state can significantly improve your overall wellbeing  and  Health.  Please know that I am here to help you with all of your health care goals and am happy to work with you to find a solution that works best for you.  The greatest advice I have received with any changes in life are to take it one step at a time, that even means if all you can focus on is the next 60 seconds, then do that and celebrate your victories.  With any changes in life, you will have set backs, and that is OK. The important thing to remember is, if you have a set back, it is not a failure, it is an opportunity to try again!  Health Maintenance Recommendations Screening Testing Mammogram Every 1 -2  years based on history and risk factors Starting at age 40 Pap Smear Ages 21-39 every 3 years Ages 30-65 every 5 years with HPV testing More frequent testing may be required based on results and history Colon Cancer Screening Every 1-10 years based on test performed, risk factors, and history Starting at age 45 Bone Density Screening Every 2-10 years based on history Starting at age 65 for women Recommendations for men differ based on medication usage, history, and risk factors AAA Screening One time ultrasound Men 65-75 years old who have every smoked Lung Cancer Screening Low Dose Lung CT every 12 months Age 55-80 years with a 30 pack-year smoking history who still smoke or who have quit within the last 15 years  Screening Labs Routine  Labs: Complete Blood Count (CBC), Complete Metabolic Panel (CMP), Cholesterol (Lipid Panel) Every 6-12 months based on history and medications May be recommended more frequently based on current conditions or previous results Hemoglobin A1c Lab Every 3-12 months based on history and previous results Starting at age 45 or earlier with diagnosis of diabetes, high cholesterol, BMI >26, and/or risk factors Frequent monitoring for patients with diabetes to ensure blood sugar control Thyroid Panel (TSH w/ T3 & T4) Every 6 months based on history, symptoms, and risk factors May be repeated more often if on medication HIV One time testing for all patients 13 and older May be   repeated more frequently for patients with increased risk factors or exposure Hepatitis C One time testing for all patients 18 and older May be repeated more frequently for patients with increased risk factors or exposure Gonorrhea, Chlamydia Every 12 months for all sexually active persons 13-24 years Additional monitoring may be recommended for those who are considered high risk or who have symptoms PSA Men 40-54 years old with risk factors Additional screening may be  recommended from age 55-69 based on risk factors, symptoms, and history  Vaccine Recommendations Tetanus Booster All adults every 10 years Flu Vaccine All patients 6 months and older every year COVID Vaccine All patients 12 years and older Initial dosing with booster May recommend additional booster based on age and health history HPV Vaccine 2 doses all patients age 9-26 Dosing may be considered for patients over 26 Shingles Vaccine (Shingrix) 2 doses all adults 55 years and older Pneumonia (Pneumovax 23) All adults 65 years and older May recommend earlier dosing based on health history Pneumonia (Prevnar 13) All adults 65 years and older Dosed 1 year after Pneumovax 23  Additional Screening, Testing, and Vaccinations may be recommended on an individualized basis based on family history, health history, risk factors, and/or exposure.   

## 2023-09-22 NOTE — Progress Notes (Signed)
Shawna Clamp, DNP, AGNP-c [facility] [address] [place], [place] [address] Main Office [phone]  BP 128/80   Pulse 78   Ht 5' 5.5" (1.664 m)   Wt 208 lb (94.3 kg)   BMI 34.09 kg/m    Subjective:    Patient ID: Mary Horn, female    DOB: September 05, 1966, 57 y.o.   MRN: [id number]  HPI: Mary Horn is a 57 y.o. female presenting on 09/22/2023 for comprehensive medical examination.   Mary Horn presented with a history of Meniere's disease and a recent diagnosis of a hiatal hernia. The patient reported a sensation of pressure in the chest, which was initially concerning for cardiac issues. However, after multiple tests, it was determined that the discomfort was not heart related. The patient self-identified the symptoms as likely related to the hiatal hernia, which included increased burping, feelings of fullness, and occasional difficulty swallowing certain foods.  The patient also reported a recent incidental finding of a spot on the liver during cardiac testing. The spot was initially thought to be a benign cyst, but further testing was recommended for confirmation. The patient expressed minimal concern about this finding, focusing more on scheduling the necessary tests before the end of the year for insurance purposes.  Regarding the Meniere's disease, the patient reported significant improvement in symptoms, attributing this to better adherence to the prescribed meclizine regimen. The patient also had diazepam on hand for severe episodes but reported no need for its use since the prescription was filled.  The patient also mentioned a recent change in thyroid medication due to feelings of shakiness. Since the adjustment, the patient reported a complete resolution of these symptoms. However, the patient expressed some concerns about potential irritability related to a prescribed statin, which was being taken inconsistently.  The patient is also managing blood pressure,  which was reported to be well-controlled at the time of the conversation. The patient is not sexually active and has been post-menopausal since the age of 17. The patient has not had a recent Pap smear but reported no concerning gynecological symptoms.  Overall, the patient is preparing for a significant life transition into retirement and is actively planning for this change. The patient reported no significant changes in bowel or bladder habits and is generally in good health aside from the aforementioned conditions.  Pertinent items are noted in HPI.  IMMUNIZATIONS:   Flu Vaccine: Flu vaccine declined, patient does not wish to complete Prevnar 13: Prevnar 13 N/A for this patient Prevnar 20: Prevnar 20 N/A for this patient Pneumovax 23: Pneumovax 23 N/A for this patient Vac Shingrix: Shingrix declined, patient does not wish to have this vaccine HPV: N/A or Aged Out Tetanus: Tetanus completed in the last 10 years COVID: Declined today. Information on where to obtain the vaccine was provided.  RSV: No  HEALTH MAINTENANCE: Pap Smear HM Status: is due and will be scheduled by patient in the near future Mammogram HM Status: is up to date Colon Cancer Screening HM Status: is due and will be scheduled by patient in the near future Bone Density HM Status: N/A STI Testing HM Status: was declined  Lung CT HM Status: is due and will be scheduled by patient in the near future  Concerns with vision, hearing, or dentition: Yes: see HPI  Most Recent Depression Screen:     09/22/2023    9:35 AM 12/30/2022    3:46 PM 09/30/2021    7:22 PM  Depression screen PHQ 2/9  Decreased Interest  0 1 1  Down, Depressed, Hopeless 0 1 1  PHQ - 2 Score 0 2 2  Altered sleeping  1 3  Tired, decreased energy  1 3  Change in appetite  3 1  Feeling bad or failure about yourself   2 3  Trouble concentrating  3 3  Moving slowly or fidgety/restless  0 1  Suicidal thoughts  0 0  PHQ-9 Score  12 16  Difficult  doing work/chores  Very difficult    Most Recent Anxiety Screen:     12/30/2022    3:46 PM 09/30/2021    7:23 PM  GAD 7 : Generalized Anxiety Score  Nervous, Anxious, on Edge 3 2  Control/stop worrying 2 3  Worry too much - different things 2 3  Trouble relaxing 2 3  Restless 1 1  Easily annoyed or irritable 3 3  Afraid - awful might happen 1 1  Total GAD 7 Score 14 16  Anxiety Difficulty Very difficult Extremely difficult   Most Recent Fall Screen:    09/22/2023    9:35 AM 12/30/2022    3:47 PM 07/14/2022    8:45 AM 09/30/2021    7:22 PM  Fall Risk   Falls in the past year? 0 0 0 0  Number falls in past yr: 0 0 0 0  Injury with Fall? 0 0 0 0  Risk for fall due to : No Fall Risks No Fall Risks No Fall Risks No Fall Risks  Follow up Falls evaluation completed Falls evaluation completed;Education provided Falls evaluation completed;Education provided Falls evaluation completed;Education provided    Past medical history, surgical history, medications, allergies, family history and social history reviewed with patient today and changes made to appropriate areas of the chart.  Past Medical History:  Past Medical History:  Diagnosis Date   Anxiety    Depression    Encounter to establish care 09/30/2021   Hypertension    Mixed hyperlipidemia 09/29/2023   Thyroid disease    Medications:  Current Outpatient Medications on File Prior to Visit  Medication Sig   Blood Pressure KIT One kit for HTN monitoring at home as covered by insurance. ICD-10 I10   cetirizine (ZYRTEC) 10 MG tablet Take 10 mg by mouth daily.   fluticasone (FLONASE) 50 MCG/ACT nasal spray Place into both nostrils daily.   levothyroxine (SYNTHROID) 25 MCG tablet Take 1 tablet (25 mcg total) by mouth as directed. 25 mcg 5 days a week, and 50 mcg 2 days a week   propranolol (INDERAL) 10 MG tablet Take 1 tablet (10 mg total) by mouth 3 (three) times daily as needed. (Patient not taking: Reported on 07/26/2023)    rosuvastatin (CRESTOR) 10 MG tablet Take 1 tablet (10mg ) by mouth at bedtime three days a week.   No current facility-administered medications on file prior to visit.   Surgical History:  Past Surgical History:  Procedure Laterality Date   WISDOM TOOTH EXTRACTION     Allergies:  No Known Allergies Family History:  Family History  Problem Relation Age of Onset   Breast cancer Mother    Pancreatic cancer Mother    Heart disease Father    Anxiety disorder Father    Anxiety disorder Sister    Asthma Sister    Depression Sister    Breast cancer Maternal Aunt        Objective:    BP 128/80   Pulse 78   Ht 5' 5.5" (1.664 m)   Wt 208  lb (94.3 kg)   BMI 34.09 kg/m   Wt Readings from Last 3 Encounters:  09/22/23 208 lb (94.3 kg)  07/26/23 208 lb 9.6 oz (94.6 kg)  06/22/23 210 lb 3.2 oz (95.3 kg)    Physical Exam Vitals and nursing note reviewed.  Constitutional:      General: She is not in acute distress.    Appearance: Normal appearance.  HENT:     Head: Normocephalic and atraumatic.     Right Ear: Hearing, tympanic membrane, ear canal and external ear normal.     Left Ear: Hearing, tympanic membrane, ear canal and external ear normal.     Nose: Nose normal.     Right Sinus: No maxillary sinus tenderness or frontal sinus tenderness.     Left Sinus: No maxillary sinus tenderness or frontal sinus tenderness.     Mouth/Throat:     Lips: Pink.     Mouth: Mucous membranes are moist.     Pharynx: Oropharynx is clear.  Eyes:     General: Lids are normal. Vision grossly intact.     Extraocular Movements: Extraocular movements intact.     Conjunctiva/sclera: Conjunctivae normal.     Pupils: Pupils are equal, round, and reactive to light.     Funduscopic exam:    Right eye: Red reflex present.        Left eye: Red reflex present.    Visual Fields: Right eye visual fields normal and left eye visual fields normal.  Neck:     Thyroid: No thyromegaly.     Vascular: No  carotid bruit.  Cardiovascular:     Rate and Rhythm: Normal rate and regular rhythm.     Chest Wall: PMI is not displaced.     Pulses: Normal pulses.          Dorsalis pedis pulses are 2+ on the right side and 2+ on the left side.       Posterior tibial pulses are 2+ on the right side and 2+ on the left side.     Heart sounds: Normal heart sounds. No murmur heard. Pulmonary:     Effort: Pulmonary effort is normal. No respiratory distress.     Breath sounds: Normal breath sounds.  Abdominal:     General: Abdomen is flat. Bowel sounds are normal. There is no distension.     Palpations: Abdomen is soft. There is no hepatomegaly, splenomegaly or mass.     Tenderness: There is no abdominal tenderness. There is no right CVA tenderness, left CVA tenderness, guarding or rebound.  Musculoskeletal:        General: Normal range of motion.     Cervical back: Full passive range of motion without pain, normal range of motion and neck supple. No tenderness.     Right lower leg: No edema.     Left lower leg: No edema.  Feet:     Left foot:     Toenail Condition: Left toenails are normal.  Lymphadenopathy:     Cervical: No cervical adenopathy.     Upper Body:     Right upper body: No supraclavicular adenopathy.     Left upper body: No supraclavicular adenopathy.  Skin:    General: Skin is warm and dry.     Capillary Refill: Capillary refill takes less than 2 seconds.     Nails: There is no clubbing.  Neurological:     General: No focal deficit present.     Mental Status: She is alert and  oriented to person, place, and time.     GCS: GCS eye subscore is 4. GCS verbal subscore is 5. GCS motor subscore is 6.     Sensory: Sensation is intact.     Motor: Motor function is intact.     Coordination: Coordination is intact.     Gait: Gait is intact.     Deep Tendon Reflexes: Reflexes are normal and symmetric.  Psychiatric:        Attention and Perception: Attention normal.        Mood and Affect:  Mood normal.        Speech: Speech normal.        Behavior: Behavior normal. Behavior is cooperative.        Thought Content: Thought content normal.        Cognition and Memory: Cognition and memory normal.        Judgment: Judgment normal.      Results for orders placed or performed in visit on 09/22/23  CBC with Differential/Platelet  Result Value Ref Range   WBC 7.2 3.4 - 10.8 x10E3/uL   RBC 4.83 3.77 - 5.28 x10E6/uL   Hemoglobin 14.3 11.1 - 15.9 g/dL   Hematocrit 16.1 09.6 - 46.6 %   MCV 92 79 - 97 fL   MCH 29.6 26.6 - 33.0 pg   MCHC 32.4 31.5 - 35.7 g/dL   RDW 04.5 40.9 - 81.1 %   Platelets 341 150 - 450 x10E3/uL   Neutrophils 56 Not Estab. %   Lymphs 34 Not Estab. %   Monocytes 6 Not Estab. %   Eos 3 Not Estab. %   Basos 1 Not Estab. %   Neutrophils Absolute 4.0 1.4 - 7.0 x10E3/uL   Lymphocytes Absolute 2.4 0.7 - 3.1 x10E3/uL   Monocytes Absolute 0.4 0.1 - 0.9 x10E3/uL   EOS (ABSOLUTE) 0.2 0.0 - 0.4 x10E3/uL   Basophils Absolute 0.1 0.0 - 0.2 x10E3/uL   Immature Granulocytes 0 Not Estab. %   Immature Grans (Abs) 0.0 0.0 - 0.1 x10E3/uL  CMP14+EGFR  Result Value Ref Range   Glucose 94 70 - 99 mg/dL   BUN 18 6 - 24 mg/dL   Creatinine, Ser 9.14 0.57 - 1.00 mg/dL   eGFR 99 >78 GN/FAO/1.30   BUN/Creatinine Ratio 25 (H) 9 - 23   Sodium 140 134 - 144 mmol/L   Potassium 4.5 3.5 - 5.2 mmol/L   Chloride 101 96 - 106 mmol/L   CO2 22 20 - 29 mmol/L   Calcium 10.1 8.7 - 10.2 mg/dL   Total Protein 7.2 6.0 - 8.5 g/dL   Albumin 4.7 3.8 - 4.9 g/dL   Globulin, Total 2.5 1.5 - 4.5 g/dL   Bilirubin Total 0.3 0.0 - 1.2 mg/dL   Alkaline Phosphatase 104 44 - 121 IU/L   AST 17 0 - 40 IU/L   ALT 22 0 - 32 IU/L  Hemoglobin A1c  Result Value Ref Range   Hgb A1c MFr Bld 6.0 (H) 4.8 - 5.6 %   Est. average glucose Bld gHb Est-mCnc 126 mg/dL  Lipid panel  Result Value Ref Range   Cholesterol, Total 195 100 - 199 mg/dL   Triglycerides 865 0 - 149 mg/dL   HDL 63 >78 mg/dL   VLDL  Cholesterol Cal 18 5 - 40 mg/dL   LDL Chol Calc (NIH) 469 (H) 0 - 99 mg/dL   Chol/HDL Ratio 3.1 0.0 - 4.4 ratio  TSH  Result Value Ref Range  TSH 2.720 0.450 - 4.500 uIU/mL  Vitamin D, 25-hydroxy  Result Value Ref Range   Vit D, 25-Hydroxy 58.3 30.0 - 100.0 ng/mL       Assessment & Plan:   Problem List Items Addressed This Visit     Obesity (BMI 30-39.9) (Chronic)    Wight stable. Encouraged diet and exercise changes       Relevant Orders   CBC with Differential/Platelet (Completed)   CMP14+EGFR (Completed)   Hemoglobin A1c (Completed)   Lipid panel (Completed)   TSH (Completed)   Encounter for annual physical exam - Primary    CPE completed today. Review of HM activities and recommendations discussed and provided on AVS. Anticipatory guidance, diet, and exercise recommendations provided. Medications, allergies, and hx reviewed and updated as necessary. Orders placed as listed below.  Plan: - Labs ordered. Will make changes as necessary based on results.  - I will review these results and send recommendations via MyChart or a telephone call.  - F/U with CPE in 1 year or sooner for acute/chronic health needs as directed.        Generalized anxiety disorder    No concerns at this time.       Relevant Medications   diazepam (VALIUM) 5 MG tablet   Primary hypertension    BP well controlled at this time with no symptoms. Continue to take propranolol as needed.       Relevant Medications   hydrochlorothiazide (HYDRODIURIL) 25 MG tablet   Other Relevant Orders   CBC with Differential/Platelet (Completed)   CMP14+EGFR (Completed)   Hemoglobin A1c (Completed)   Lipid panel (Completed)   TSH (Completed)   Hypothyroidism    No concerning symptoms present. Labs completed in August. Continue levothyroxine.      Relevant Orders   CBC with Differential/Platelet (Completed)   CMP14+EGFR (Completed)   TSH (Completed)   Pre-diabetes    Chronic.  She is working on diet and  exercise for weight management.  Has not been checking blood sugars but she has been increasing her water and physical activity which is likely to improve this.  Pended labs today.      Relevant Orders   CBC with Differential/Platelet (Completed)   CMP14+EGFR (Completed)   Hemoglobin A1c (Completed)   Lipid panel (Completed)   Meniere's disease of both ears    Requests refills for diazepam and meclizine for episodic management. Reports effective symptom control with meclizine at onset. - Refill diazepam 5 mg as needed - Refill meclizine 25 mg as needed       Relevant Medications   diazepam (VALIUM) 5 MG tablet   meclizine (ANTIVERT) 25 MG tablet   Hiatal hernia    Intermittent chest pressure and discomfort postprandially, with increased burping, sour stomach, and occasional dysphagia. Cardiac causes ruled out by cardiologist. Symptoms consistent with hiatal hernia. - Monitor symptoms - Consider GI referral if symptoms worsen - Use Pepcid as needed for symptom relief      Liver cyst    Incidental liver cyst found during imaging for chest discomfort. Ultrasound suggests benign cyst; MRI recommended for further evaluation. Patient concerned about scheduling MRI before year-end due to insurance deductible. - Order MRI of the liver - Follow up on MRI results      Liver disease   Relevant Orders   MR LIVER W WO CONTRAST   Mixed hyperlipidemia    Inconsistent statin use due to perceived irritability. Advised to take medication three times a week and monitor for  side effects. Explained efficacy of reduced frequency. - Refill statin - Advise to take statin three times a week and monitor for irritability      Relevant Medications   hydrochlorothiazide (HYDRODIURIL) 25 MG tablet   Vitamin D deficiency   Relevant Medications   Cholecalciferol (VITAMIN D3) 1.25 MG (50000 UT) CAPS   Other Relevant Orders   Vitamin D, 25-hydroxy (Completed)      Follow up plan: Return in about 1  year (around 09/21/2024) for CPE.  NEXT PREVENTATIVE PHYSICAL DUE IN 1 YEAR.  PATIENT COUNSELING PROVIDED FOR ALL ADULT PATIENTS: A well balanced diet low in saturated fats, cholesterol, and moderation in carbohydrates.  This can be as simple as monitoring portion sizes and cutting back on sugary beverages such as soda and juice to start with.    Daily water consumption of at least 64 ounces.  Physical activity at least 180 minutes per week.  If just starting out, start 10 minutes a day and work your way up.   This can be as simple as taking the stairs instead of the elevator and walking 2-3 laps around the office  purposefully every day.   STD protection, partner selection, and regular testing if high risk.  Limited consumption of alcoholic beverages if alcohol is consumed. For men, I recommend no more than 14 alcoholic beverages per week, spread out throughout the week (max 2 per day). Avoid "binge" drinking or consuming large quantities of alcohol in one setting.  Please let me know if you feel you may need help with reduction or quitting alcohol consumption.   Avoidance of nicotine, if used. Please let me know if you feel you may need help with reduction or quitting nicotine use.   Daily mental health attention. This can be in the form of 5 minute daily meditation, prayer, journaling, yoga, reflection, etc.  Purposeful attention to your emotions and mental state can significantly improve your overall wellbeing  and  Health.  Please know that I am here to help you with all of your health care goals and am happy to work with you to find a solution that works best for you.  The greatest advice I have received with any changes in life are to take it one step at a time, that even means if all you can focus on is the next 60 seconds, then do that and celebrate your victories.  With any changes in life, you will have set backs, and that is OK. The important thing to remember is, if you have  a set back, it is not a failure, it is an opportunity to try again! Screening Testing Mammogram Every 1 -2 years based on history and risk factors Starting at age 47 Pap Smear Ages 21-39 every 3 years Ages 68-65 every 5 years with HPV testing More frequent testing may be required based on results and history Colon Cancer Screening Every 1-10 years based on test performed, risk factors, and history Starting at age 79 Bone Density Screening Every 2-10 years based on history Starting at age 15 for women Recommendations for men differ based on medication usage, history, and risk factors AAA Screening One time ultrasound Men 106-21 years old who have every smoked Lung Cancer Screening Low Dose Lung CT every 12 months Age 28-80 years with a 30 pack-year smoking history who still smoke or who have quit within the last 15 years   Screening Labs Routine  Labs: Complete Blood Count (CBC), Complete Metabolic Panel (CMP), Cholesterol (  Lipid Panel) Every 6-12 months based on history and medications May be recommended more frequently based on current conditions or previous results Hemoglobin A1c Lab Every 3-12 months based on history and previous results Starting at age 30 or earlier with diagnosis of diabetes, high cholesterol, BMI >26, and/or risk factors Frequent monitoring for patients with diabetes to ensure blood sugar control Thyroid Panel (TSH) Every 6 months based on history, symptoms, and risk factors May be repeated more often if on medication HIV One time testing for all patients 4 and older May be repeated more frequently for patients with increased risk factors or exposure Hepatitis C One time testing for all patients 48 and older May be repeated more frequently for patients with increased risk factors or exposure Gonorrhea, Chlamydia Every 12 months for all sexually active persons 13-24 years Additional monitoring may be recommended for those who are considered high risk  or who have symptoms Every 12 months for any woman on birth control, regardless of sexual activity PSA Men 57-72 years old with risk factors Additional screening may be recommended from age 71-69 based on risk factors, symptoms, and history  Vaccine Recommendations Tetanus Booster All adults every 10 years Flu Vaccine All patients 6 months and older every year COVID Vaccine All patients 12 years and older Initial dosing with booster May recommend additional booster based on age and health history HPV Vaccine 2 doses all patients age 20-26 Dosing may be considered for patients over 26 Shingles Vaccine (Shingrix) 2 doses all adults 55 years and older Pneumonia (Pneumovax 23) All adults 65 years and older May recommend earlier dosing based on health history One year apart from Prevnar 78 Pneumonia (Prevnar 12) All adults 65 years and older Dosed 1 year after Pneumovax 23 Pneumonia (Prevnar 20) One time alternative to the two dosing of 13 and 23 For all adults with initial dose of 23, 20 is recommended 1 year later For all adults with initial dose of 13, 23 is still recommended as second option 1 year later

## 2023-09-23 LAB — CMP14+EGFR
ALT: 22 [IU]/L (ref 0–32)
AST: 17 IU/L (ref 0–40)
Albumin: 4.7 g/dL (ref 3.8–4.9)
Alkaline Phosphatase: 104 IU/L (ref 44–121)
BUN/Creatinine Ratio: 25 — ABNORMAL HIGH (ref 9–23)
BUN: 18 mg/dL (ref 6–24)
Bilirubin Total: 0.3 mg/dL (ref 0.0–1.2)
CO2: 22 mmol/L (ref 20–29)
Calcium: 10.1 mg/dL (ref 8.7–10.2)
Chloride: 101 mmol/L (ref 96–106)
Creatinine, Ser: 0.71 mg/dL (ref 0.57–1.00)
Globulin, Total: 2.5 g/dL (ref 1.5–4.5)
Glucose: 94 mg/dL (ref 70–99)
Potassium: 4.5 mmol/L (ref 3.5–5.2)
Sodium: 140 mmol/L (ref 134–144)
Total Protein: 7.2 g/dL (ref 6.0–8.5)
eGFR: 99 mL/min/{1.73_m2} (ref >59)

## 2023-09-23 LAB — CBC WITH DIFFERENTIAL/PLATELET
Basophils Absolute: 0.1 10*3/uL (ref 0.0–0.2)
Basos: 1 %
EOS (ABSOLUTE): 0.2 10*3/uL (ref 0.0–0.4)
Eos: 3 %
Hematocrit: 44.2 % (ref 34.0–46.6)
Hemoglobin: 14.3 g/dL (ref 11.1–15.9)
Immature Grans (Abs): 0 10*3/uL (ref 0.0–0.1)
Immature Granulocytes: 0 %
Lymphocytes Absolute: 2.4 10*3/uL (ref 0.7–3.1)
Lymphs: 34 %
MCH: 29.6 pg (ref 26.6–33.0)
MCHC: 32.4 g/dL (ref 31.5–35.7)
MCV: 92 fL (ref 79–97)
Monocytes Absolute: 0.4 10*3/uL (ref 0.1–0.9)
Monocytes: 6 %
Neutrophils Absolute: 4 10*3/uL (ref 1.4–7.0)
Neutrophils: 56 %
Platelets: 341 10*3/uL (ref 150–450)
RBC: 4.83 x10E6/uL (ref 3.77–5.28)
RDW: 13.1 % (ref 11.7–15.4)
WBC: 7.2 10*3/uL (ref 3.4–10.8)

## 2023-09-23 LAB — LIPID PANEL
Chol/HDL Ratio: 3.1 ratio (ref 0.0–4.4)
Cholesterol, Total: 195 mg/dL (ref 100–199)
HDL: 63 mg/dL (ref >39)
LDL Chol Calc (NIH): 114 mg/dL — ABNORMAL HIGH (ref 0–99)
Triglycerides: 102 mg/dL (ref 0–149)
VLDL Cholesterol Cal: 18 mg/dL (ref 5–40)

## 2023-09-23 LAB — HEMOGLOBIN A1C
Est. average glucose Bld gHb Est-mCnc: 126 mg/dL
Hgb A1c MFr Bld: 6 % — ABNORMAL HIGH (ref 4.8–5.6)

## 2023-09-23 LAB — TSH: TSH: 2.72 u[IU]/mL (ref 0.450–4.500)

## 2023-09-23 LAB — VITAMIN D 25 HYDROXY (VIT D DEFICIENCY, FRACTURES): Vit D, 25-Hydroxy: 58.3 ng/mL (ref 30.0–100.0)

## 2023-09-26 ENCOUNTER — Other Ambulatory Visit: Payer: Self-pay | Admitting: Family

## 2023-09-26 DIAGNOSIS — I10 Essential (primary) hypertension: Secondary | ICD-10-CM

## 2023-09-29 ENCOUNTER — Encounter: Payer: Self-pay | Admitting: Nurse Practitioner

## 2023-09-29 DIAGNOSIS — K7689 Other specified diseases of liver: Secondary | ICD-10-CM | POA: Insufficient documentation

## 2023-09-29 DIAGNOSIS — H8103 Meniere's disease, bilateral: Secondary | ICD-10-CM | POA: Insufficient documentation

## 2023-09-29 DIAGNOSIS — K449 Diaphragmatic hernia without obstruction or gangrene: Secondary | ICD-10-CM | POA: Insufficient documentation

## 2023-09-29 DIAGNOSIS — E782 Mixed hyperlipidemia: Secondary | ICD-10-CM | POA: Insufficient documentation

## 2023-09-29 DIAGNOSIS — K769 Liver disease, unspecified: Secondary | ICD-10-CM | POA: Insufficient documentation

## 2023-09-29 NOTE — Assessment & Plan Note (Signed)
Wight stable. Encouraged diet and exercise changes

## 2023-09-29 NOTE — Assessment & Plan Note (Signed)
Incidental liver cyst found during imaging for chest discomfort. Ultrasound suggests benign cyst; MRI recommended for further evaluation. Patient concerned about scheduling MRI before year-end due to insurance deductible. - Order MRI of the liver - Follow up on MRI results

## 2023-09-29 NOTE — Assessment & Plan Note (Signed)
Intermittent chest pressure and discomfort postprandially, with increased burping, sour stomach, and occasional dysphagia. Cardiac causes ruled out by cardiologist. Symptoms consistent with hiatal hernia. - Monitor symptoms - Consider GI referral if symptoms worsen - Use Pepcid as needed for symptom relief

## 2023-09-29 NOTE — Assessment & Plan Note (Signed)
Requests refills for diazepam and meclizine for episodic management. Reports effective symptom control with meclizine at onset. - Refill diazepam 5 mg as needed - Refill meclizine 25 mg as needed

## 2023-09-29 NOTE — Assessment & Plan Note (Signed)
BP well controlled at this time with no symptoms. Continue to take propranolol as needed.

## 2023-09-29 NOTE — Assessment & Plan Note (Signed)
No concerning symptoms present. Labs completed in August. Continue levothyroxine.

## 2023-09-29 NOTE — Assessment & Plan Note (Signed)
Chronic.  She is working on diet and exercise for weight management.  Has not been checking blood sugars but she has been increasing her water and physical activity which is likely to improve this.  Pended labs today.

## 2023-09-29 NOTE — Assessment & Plan Note (Signed)
No concerns at this time. `

## 2023-09-29 NOTE — Assessment & Plan Note (Signed)
Inconsistent statin use due to perceived irritability. Advised to take medication three times a week and monitor for side effects. Explained efficacy of reduced frequency. - Refill statin - Advise to take statin three times a week and monitor for irritability

## 2023-09-29 NOTE — Assessment & Plan Note (Signed)

## 2023-10-04 ENCOUNTER — Other Ambulatory Visit: Payer: Self-pay | Admitting: Internal Medicine

## 2023-10-15 DIAGNOSIS — Z419 Encounter for procedure for purposes other than remedying health state, unspecified: Secondary | ICD-10-CM | POA: Diagnosis not present

## 2023-10-19 ENCOUNTER — Other Ambulatory Visit: Payer: BC Managed Care – PPO

## 2023-10-19 DIAGNOSIS — E782 Mixed hyperlipidemia: Secondary | ICD-10-CM | POA: Diagnosis not present

## 2023-10-20 ENCOUNTER — Ambulatory Visit (HOSPITAL_COMMUNITY)
Admission: RE | Admit: 2023-10-20 | Discharge: 2023-10-20 | Disposition: A | Discharge status: Home / Self Care | Payer: BC Managed Care – PPO | Source: Ambulatory Visit | Attending: Nurse Practitioner | Admitting: Nurse Practitioner

## 2023-10-20 DIAGNOSIS — K760 Fatty (change of) liver, not elsewhere classified: Secondary | ICD-10-CM | POA: Diagnosis not present

## 2023-10-20 DIAGNOSIS — I700 Atherosclerosis of aorta: Secondary | ICD-10-CM | POA: Diagnosis not present

## 2023-10-20 DIAGNOSIS — K769 Liver disease, unspecified: Secondary | ICD-10-CM | POA: Insufficient documentation

## 2023-10-20 DIAGNOSIS — D1803 Hemangioma of intra-abdominal structures: Secondary | ICD-10-CM | POA: Diagnosis not present

## 2023-10-20 LAB — COMPREHENSIVE METABOLIC PANEL
ALT: 21 [IU]/L (ref 0–32)
AST: 22 [IU]/L (ref 0–40)
Albumin: 4.8 g/dL (ref 3.8–4.9)
Alkaline Phosphatase: 99 [IU]/L (ref 44–121)
BUN/Creatinine Ratio: 20 (ref 9–23)
BUN: 18 mg/dL (ref 6–24)
Bilirubin Total: 0.6 mg/dL (ref 0.0–1.2)
CO2: 24 mmol/L (ref 20–29)
Calcium: 9.9 mg/dL (ref 8.7–10.2)
Chloride: 101 mmol/L (ref 96–106)
Creatinine, Ser: 0.88 mg/dL (ref 0.57–1.00)
Globulin, Total: 2.7 g/dL (ref 1.5–4.5)
Glucose: 122 mg/dL — ABNORMAL HIGH (ref 70–99)
Potassium: 4.4 mmol/L (ref 3.5–5.2)
Sodium: 144 mmol/L (ref 134–144)
Total Protein: 7.5 g/dL (ref 6.0–8.5)
eGFR: 77 mL/min/{1.73_m2} (ref >59)

## 2023-10-20 MED ORDER — GADOBUTROL 1 MMOL/ML IV SOLN
10.0000 mL | Freq: Once | INTRAVENOUS | Status: AC | PRN
  Administered 2023-10-20: 10 mL via INTRAVENOUS

## 2023-10-30 ENCOUNTER — Encounter: Payer: Self-pay | Admitting: Nurse Practitioner

## 2023-11-15 DIAGNOSIS — Z419 Encounter for procedure for purposes other than remedying health state, unspecified: Secondary | ICD-10-CM | POA: Diagnosis not present

## 2023-12-16 DIAGNOSIS — Z419 Encounter for procedure for purposes other than remedying health state, unspecified: Secondary | ICD-10-CM | POA: Diagnosis not present

## 2024-01-13 DIAGNOSIS — Z419 Encounter for procedure for purposes other than remedying health state, unspecified: Secondary | ICD-10-CM | POA: Diagnosis not present

## 2024-02-24 DIAGNOSIS — Z419 Encounter for procedure for purposes other than remedying health state, unspecified: Secondary | ICD-10-CM | POA: Diagnosis not present

## 2024-03-25 DIAGNOSIS — Z419 Encounter for procedure for purposes other than remedying health state, unspecified: Secondary | ICD-10-CM | POA: Diagnosis not present

## 2024-04-25 DIAGNOSIS — Z419 Encounter for procedure for purposes other than remedying health state, unspecified: Secondary | ICD-10-CM | POA: Diagnosis not present

## 2024-05-25 DIAGNOSIS — Z419 Encounter for procedure for purposes other than remedying health state, unspecified: Secondary | ICD-10-CM | POA: Diagnosis not present

## 2024-06-25 DIAGNOSIS — Z419 Encounter for procedure for purposes other than remedying health state, unspecified: Secondary | ICD-10-CM | POA: Diagnosis not present

## 2024-07-26 DIAGNOSIS — Z419 Encounter for procedure for purposes other than remedying health state, unspecified: Secondary | ICD-10-CM | POA: Diagnosis not present

## 2024-09-27 ENCOUNTER — Other Ambulatory Visit: Payer: Self-pay | Admitting: Nurse Practitioner

## 2024-09-27 DIAGNOSIS — I10 Essential (primary) hypertension: Secondary | ICD-10-CM

## 2024-09-27 DIAGNOSIS — E038 Other specified hypothyroidism: Secondary | ICD-10-CM

## 2024-11-04 ENCOUNTER — Other Ambulatory Visit: Payer: Self-pay | Admitting: Nurse Practitioner

## 2024-11-04 DIAGNOSIS — E782 Mixed hyperlipidemia: Secondary | ICD-10-CM

## 2024-11-04 DIAGNOSIS — E038 Other specified hypothyroidism: Secondary | ICD-10-CM

## 2024-11-04 DIAGNOSIS — I10 Essential (primary) hypertension: Secondary | ICD-10-CM

## 2024-11-04 NOTE — Telephone Encounter (Unsigned)
 Copied from CRM #[id number]. Topic: Clinical - Medication Refill >> Nov 04, 2024 11:37 AM Hadassah PARAS wrote: Medication: levothyroxine  (SYNTHROID ) 25 MCG tablet; hydrochlorothiazide  (HYDRODIURIL ) 25 MG tablet; rosuvastatin  (CRESTOR ) 10 MG tablet     Has the patient contacted their pharmacy? Yes (Agent: If no, request that the patient contact the pharmacy for the refill. If patient does not wish to contact the pharmacy document the reason why and proceed with request.) (Agent: If yes, when and what did the pharmacy advise?)  This is the patient's preferred pharmacy:  [facility] [address] [place] - [address] [place] [address] Phone: [phone] Fax: [phone]    Is this the correct pharmacy for this prescription? Yes If no, delete pharmacy and type the correct one.   Has the prescription been filled recently? Yes  Is the patient out of the medication? No  Has the patient been seen for an appointment in the last year OR does the patient have an upcoming appointment? Yes  Can we respond through MyChart? Yes  Agent: Please be advised that Rx refills may take up to 3 business days. We ask that you follow-up with your pharmacy.

## 2024-11-05 MED ORDER — LEVOTHYROXINE SODIUM 25 MCG PO TABS: 25.0000 ug | ORAL_TABLET | ORAL | 0 refills | Status: DC

## 2024-11-05 MED ORDER — HYDROCHLOROTHIAZIDE 25 MG PO TABS: 25.0000 mg | ORAL_TABLET | Freq: Every day | ORAL | 0 refills | Status: DC

## 2024-11-05 MED ORDER — ROSUVASTATIN CALCIUM 10 MG PO TABS: ORAL_TABLET | ORAL | 3 refills | Status: AC

## 2024-12-06 ENCOUNTER — Other Ambulatory Visit: Payer: Self-pay | Admitting: Nurse Practitioner

## 2024-12-06 DIAGNOSIS — I10 Essential (primary) hypertension: Secondary | ICD-10-CM

## 2024-12-10 ENCOUNTER — Other Ambulatory Visit: Payer: Self-pay | Admitting: Nurse Practitioner

## 2024-12-10 DIAGNOSIS — E038 Other specified hypothyroidism: Secondary | ICD-10-CM

## 2024-12-31 ENCOUNTER — Ambulatory Visit: Admitting: Nurse Practitioner
